# Patient Record
Sex: Female | Born: 1965
Health system: Southern US, Community
[De-identification: ages and names within clinical notes are randomized; demographics above are authoritative.]

## PROBLEM LIST (undated history)

## (undated) ENCOUNTER — Ambulatory Visit (HOSPITAL_COMMUNITY): Disposition: A | Discharge status: Home / Self Care | Payer: BC Managed Care – PPO

## (undated) DIAGNOSIS — D649 Anemia, unspecified: Secondary | ICD-10-CM

## (undated) DIAGNOSIS — Z91018 Allergy to other foods: Secondary | ICD-10-CM

## (undated) DIAGNOSIS — T7840XA Allergy, unspecified, initial encounter: Secondary | ICD-10-CM

## (undated) DIAGNOSIS — H409 Unspecified glaucoma: Secondary | ICD-10-CM

## (undated) DIAGNOSIS — R7303 Prediabetes: Secondary | ICD-10-CM

## (undated) DIAGNOSIS — E538 Deficiency of other specified B group vitamins: Secondary | ICD-10-CM

## (undated) DIAGNOSIS — K829 Disease of gallbladder, unspecified: Secondary | ICD-10-CM

## (undated) DIAGNOSIS — E559 Vitamin D deficiency, unspecified: Secondary | ICD-10-CM

## (undated) DIAGNOSIS — M797 Fibromyalgia: Secondary | ICD-10-CM

## (undated) HISTORY — DX: Disease of gallbladder, unspecified: K82.9

## (undated) HISTORY — PX: NO PAST SURGERIES: SHX2092

## (undated) HISTORY — DX: Fibromyalgia: M79.7

## (undated) HISTORY — DX: Allergy, unspecified, initial encounter: T78.40XA

## (undated) HISTORY — DX: Vitamin D deficiency, unspecified: E55.9

## (undated) HISTORY — DX: Allergy to other foods: Z91.018

## (undated) HISTORY — DX: Deficiency of other specified B group vitamins: E53.8

---

## 1999-10-07 ENCOUNTER — Inpatient Hospital Stay (HOSPITAL_COMMUNITY): Admission: AD | Admit: 1999-10-07 | Discharge: 1999-10-07 | Payer: Self-pay | Admitting: *Deleted

## 1999-10-09 ENCOUNTER — Encounter (INDEPENDENT_AMBULATORY_CARE_PROVIDER_SITE_OTHER): Payer: Self-pay | Admitting: Specialist

## 1999-10-09 ENCOUNTER — Observation Stay (HOSPITAL_COMMUNITY): Admission: AD | Admit: 1999-10-09 | Discharge: 1999-10-10 | Payer: Self-pay | Admitting: Obstetrics & Gynecology

## 2008-05-20 ENCOUNTER — Emergency Department (HOSPITAL_COMMUNITY): Admission: EM | Admit: 2008-05-20 | Discharge: 2008-05-20 | Payer: Self-pay | Admitting: Family Medicine

## 2008-05-22 ENCOUNTER — Emergency Department (HOSPITAL_COMMUNITY): Admission: EM | Admit: 2008-05-22 | Discharge: 2008-05-22 | Payer: Self-pay | Admitting: Emergency Medicine

## 2009-12-14 ENCOUNTER — Encounter: Admission: RE | Admit: 2009-12-14 | Discharge: 2009-12-14 | Payer: Self-pay | Admitting: Internal Medicine

## 2010-12-11 ENCOUNTER — Encounter: Payer: Self-pay | Admitting: Internal Medicine

## 2011-09-27 ENCOUNTER — Other Ambulatory Visit: Payer: Self-pay | Admitting: Internal Medicine

## 2011-09-27 DIAGNOSIS — Z1231 Encounter for screening mammogram for malignant neoplasm of breast: Secondary | ICD-10-CM

## 2011-10-23 ENCOUNTER — Ambulatory Visit
Admission: RE | Admit: 2011-10-23 | Discharge: 2011-10-23 | Disposition: A | Discharge status: Home / Self Care | Payer: Managed Care, Other (non HMO) | Source: Ambulatory Visit | Attending: Internal Medicine | Admitting: Internal Medicine

## 2011-10-23 DIAGNOSIS — Z1231 Encounter for screening mammogram for malignant neoplasm of breast: Secondary | ICD-10-CM

## 2012-09-18 ENCOUNTER — Other Ambulatory Visit: Payer: Self-pay | Admitting: Internal Medicine

## 2012-09-18 DIAGNOSIS — Z1231 Encounter for screening mammogram for malignant neoplasm of breast: Secondary | ICD-10-CM

## 2012-11-06 ENCOUNTER — Ambulatory Visit
Admission: RE | Admit: 2012-11-06 | Discharge: 2012-11-06 | Disposition: A | Discharge status: Home / Self Care | Payer: Managed Care, Other (non HMO) | Source: Ambulatory Visit | Attending: Internal Medicine | Admitting: Internal Medicine

## 2012-11-06 DIAGNOSIS — Z1231 Encounter for screening mammogram for malignant neoplasm of breast: Secondary | ICD-10-CM

## 2013-12-03 ENCOUNTER — Other Ambulatory Visit: Payer: Self-pay

## 2013-12-03 DIAGNOSIS — Z1231 Encounter for screening mammogram for malignant neoplasm of breast: Secondary | ICD-10-CM

## 2013-12-19 ENCOUNTER — Ambulatory Visit: Payer: Managed Care, Other (non HMO)

## 2014-01-01 ENCOUNTER — Ambulatory Visit: Payer: Managed Care, Other (non HMO)

## 2015-01-04 ENCOUNTER — Ambulatory Visit: Payer: Managed Care, Other (non HMO)

## 2015-01-11 ENCOUNTER — Ambulatory Visit: Payer: Managed Care, Other (non HMO)

## 2015-04-12 ENCOUNTER — Other Ambulatory Visit: Payer: Self-pay

## 2015-04-12 DIAGNOSIS — Z1231 Encounter for screening mammogram for malignant neoplasm of breast: Secondary | ICD-10-CM

## 2015-04-15 ENCOUNTER — Ambulatory Visit: Payer: Self-pay

## 2015-04-20 ENCOUNTER — Ambulatory Visit
Admission: RE | Admit: 2015-04-20 | Discharge: 2015-04-20 | Disposition: A | Discharge status: Home / Self Care | Payer: BLUE CROSS/BLUE SHIELD | Source: Ambulatory Visit

## 2015-04-20 DIAGNOSIS — Z1231 Encounter for screening mammogram for malignant neoplasm of breast: Secondary | ICD-10-CM

## 2015-08-18 DIAGNOSIS — L659 Nonscarring hair loss, unspecified: Secondary | ICD-10-CM | POA: Insufficient documentation

## 2015-10-21 ENCOUNTER — Other Ambulatory Visit: Payer: Self-pay | Admitting: Gastroenterology

## 2015-12-08 ENCOUNTER — Encounter (HOSPITAL_COMMUNITY): Payer: Self-pay | Admitting: *Deleted

## 2015-12-13 NOTE — Anesthesia Preprocedure Evaluation (Addendum)
Anesthesia Evaluation  Patient identified by MRN, date of birth, ID band Patient awake    Reviewed: Allergy & Precautions, NPO status , Patient's Chart, lab work & pertinent test results  Airway Mallampati: II   Neck ROM: Full    Dental  (+) Teeth Intact, Dental Advisory Given   Pulmonary neg pulmonary ROS,    breath sounds clear to auscultation       Cardiovascular negative cardio ROS   Rhythm:Regular     Neuro/Psych negative neurological ROS  negative psych ROS   GI/Hepatic negative GI ROS, Neg liver ROS,   Endo/Other  negative endocrine ROSMorbid obesity  Renal/GU negative Renal ROS  negative genitourinary   Musculoskeletal negative musculoskeletal ROS (+)   Abdominal (+) + obese,   Peds negative pediatric ROS (+)  Hematology negative hematology ROS (+)   Anesthesia Other Findings HX of glaucoma    Reproductive/Obstetrics negative OB ROS                           Anesthesia Physical Anesthesia Plan  ASA: III  Anesthesia Plan: MAC   Post-op Pain Management:    Induction: Intravenous  Airway Management Planned: Nasal Cannula  Additional Equipment:   Intra-op Plan:   Post-operative Plan:   Informed Consent: I have reviewed the patients History and Physical, chart, labs and discussed the procedure including the risks, benefits and alternatives for the proposed anesthesia with the patient or authorized representative who has indicated his/her understanding and acceptance.     Plan Discussed with:   Anesthesia Plan Comments:        Anesthesia Quick Evaluation

## 2015-12-16 ENCOUNTER — Ambulatory Visit (HOSPITAL_COMMUNITY)
Admission: RE | Admit: 2015-12-16 | Discharge: 2015-12-16 | Disposition: A | Discharge status: Home / Self Care | Payer: BLUE CROSS/BLUE SHIELD | Source: Ambulatory Visit | Attending: Gastroenterology | Admitting: Gastroenterology

## 2015-12-16 ENCOUNTER — Ambulatory Visit (HOSPITAL_COMMUNITY): Payer: BLUE CROSS/BLUE SHIELD | Admitting: Anesthesiology

## 2015-12-16 ENCOUNTER — Encounter (HOSPITAL_COMMUNITY): Payer: Self-pay

## 2015-12-16 ENCOUNTER — Encounter (HOSPITAL_COMMUNITY)
Admission: RE | Disposition: A | Discharge status: Home / Self Care | Payer: Self-pay | Source: Ambulatory Visit | Attending: Gastroenterology

## 2015-12-16 DIAGNOSIS — D123 Benign neoplasm of transverse colon: Secondary | ICD-10-CM | POA: Diagnosis not present

## 2015-12-16 DIAGNOSIS — Z1211 Encounter for screening for malignant neoplasm of colon: Secondary | ICD-10-CM | POA: Diagnosis not present

## 2015-12-16 DIAGNOSIS — K5730 Diverticulosis of large intestine without perforation or abscess without bleeding: Secondary | ICD-10-CM | POA: Insufficient documentation

## 2015-12-16 DIAGNOSIS — K621 Rectal polyp: Secondary | ICD-10-CM | POA: Diagnosis not present

## 2015-12-16 DIAGNOSIS — H409 Unspecified glaucoma: Secondary | ICD-10-CM | POA: Insufficient documentation

## 2015-12-16 DIAGNOSIS — K635 Polyp of colon: Secondary | ICD-10-CM | POA: Diagnosis not present

## 2015-12-16 DIAGNOSIS — K648 Other hemorrhoids: Secondary | ICD-10-CM | POA: Diagnosis not present

## 2015-12-16 DIAGNOSIS — Z6841 Body Mass Index (BMI) 40.0 and over, adult: Secondary | ICD-10-CM | POA: Insufficient documentation

## 2015-12-16 DIAGNOSIS — Z79899 Other long term (current) drug therapy: Secondary | ICD-10-CM | POA: Diagnosis not present

## 2015-12-16 HISTORY — PX: COLONOSCOPY WITH PROPOFOL: SHX5780

## 2015-12-16 HISTORY — DX: Unspecified glaucoma: H40.9

## 2015-12-16 SURGERY — COLONOSCOPY WITH PROPOFOL
Anesthesia: Monitor Anesthesia Care

## 2015-12-16 MED ORDER — PROPOFOL 10 MG/ML IV BOLUS
INTRAVENOUS | Status: DC | PRN
  Administered 2015-12-16 (×2): 100 mg via INTRAVENOUS
  Administered 2015-12-16 (×2): 20 mg via INTRAVENOUS
  Administered 2015-12-16: 80 mg via INTRAVENOUS

## 2015-12-16 MED ORDER — LIDOCAINE HCL (CARDIAC) 20 MG/ML IV SOLN
INTRAVENOUS | Status: DC | PRN
  Administered 2015-12-16: 50 mg via INTRAVENOUS

## 2015-12-16 MED ORDER — PROPOFOL 10 MG/ML IV BOLUS
INTRAVENOUS | Status: AC
Start: 2015-12-16 — End: 2015-12-16
  Filled 2015-12-16: qty 40

## 2015-12-16 MED ORDER — PROPOFOL 10 MG/ML IV BOLUS
INTRAVENOUS | Status: AC
  Filled 2015-12-16: qty 20

## 2015-12-16 MED ORDER — LIDOCAINE HCL (CARDIAC) 20 MG/ML IV SOLN
INTRAVENOUS | Status: AC
  Filled 2015-12-16: qty 5

## 2015-12-16 MED ORDER — LACTATED RINGERS IV SOLN
INTRAVENOUS | Status: DC | PRN
  Administered 2015-12-16: 07:00:00 via INTRAVENOUS

## 2015-12-16 MED ORDER — SODIUM CHLORIDE 0.9 % IV SOLN: INTRAVENOUS | Status: DC

## 2015-12-16 SURGICAL SUPPLY — 21 items

## 2015-12-16 NOTE — Transfer of Care (Signed)
Immediate Anesthesia Transfer of Care Note  Patient: Kelly Ward  Procedure(s) Performed: Procedure(s): COLONOSCOPY WITH PROPOFOL (N/A)  Patient Location: PACU  Anesthesia Type:MAC  Level of Consciousness: awake, alert  and oriented  Airway & Oxygen Therapy: Patient Spontanous Breathing and Patient connected to face mask oxygen  Post-op Assessment: Report given to RN and Post -op Vital signs reviewed and stable  Post vital signs: Reviewed and stable  Last Vitals:  Filed Vitals:   12/16/15 0652  BP: 143/76  Pulse: 80  Temp: 36.8 C  Resp: 10    Complications: No apparent anesthesia complications

## 2015-12-16 NOTE — H&P (Signed)
Kelly Ward is an 50 y.o. female.   Chief Complaint: Colorectal cancer screening. HPI: 50 year old,orbidly obese black female, here for a screening colonoscopy. See office notes details.  Past Medical History  Diagnosis Date  . Glaucoma     both eyes   Past Surgical History  Procedure Laterality Date  . No past surgeries     History reviewed. No pertinent family history. Social History:  reports that she has never smoked. She has never used smokeless tobacco. She reports that she does not drink alcohol or use illicit drugs.  Allergies:  Allergies  Allergen Reactions  . Peanut-Containing Drug Products Hives, Itching and Swelling    Eyes, mouth. throat   Medications Prior to Admission  Medication Sig Dispense Refill  . ALPHAGAN P 0.1 % SOLN 1 DROP IN EACH AFFECTED EYE 3 TIMES A DAY FOR 30 DAYS  6  . COMBIGAN 0.2-0.5 % ophthalmic solution INSTILL 1 DROP IN EACH AFFECTED EYE EVERY 12 HOURS  6  . Multiple Vitamin (MULTIVITAMIN) LIQD Take 5 mLs by mouth daily. TLC Brand    . PEG 3350-KCl-NaBcb-NaCl-NaSulf (PEG-3350/ELECTROLYTES) 236 g SOLR See admin instructions.  0  . topiramate (TOPAMAX) 100 MG tablet Take 100 mg by mouth daily.  2  . diphenhydrAMINE (BENADRYL) 25 mg capsule Take 25 mg by mouth every 6 (six) hours as needed for itching, allergies or sleep.     Review of Systems  Constitutional: Negative.   HENT: Negative.   Eyes: Negative.   Respiratory: Negative.   Cardiovascular: Negative.   Gastrointestinal: Negative.   Genitourinary: Negative.   Musculoskeletal: Negative.   Skin: Negative.   Neurological: Negative.   Endo/Heme/Allergies: Negative.   Psychiatric/Behavioral: Negative.    Blood pressure 143/76, pulse 80, temperature 98.2 F (36.8 C), temperature source Oral, resp. rate 10, height 5\' 7"  (1.702 m), weight 147.419 kg (325 lb), last menstrual period 12/09/2015, SpO2 100 %. Physical Exam  Constitutional: She is oriented to person, place, and time. She  appears well-developed and well-nourished.  HENT:  Head: Normocephalic and atraumatic.  Eyes: Conjunctivae and EOM are normal. Pupils are equal, round, and reactive to light.  Neck: Normal range of motion. Neck supple.  Cardiovascular: Normal rate and regular rhythm.   Respiratory: Effort normal and breath sounds normal.  GI: Soft. Bowel sounds are normal.  Musculoskeletal: Normal range of motion.  Neurological: She is alert and oriented to person, place, and time.  Skin: Skin is warm and dry.  Psychiatric: She has a normal mood and affect. Her behavior is normal. Judgment and thought content normal.    Assessment/Plan Colorectal cancer screening: proceed with a colonoscopy at this time.  Kelly Ward 12/16/2015, 7:18 AM

## 2015-12-16 NOTE — Anesthesia Postprocedure Evaluation (Signed)
Anesthesia Post Note  Patient: Kelly Ward  Procedure(s) Performed: Procedure(s) (LRB): COLONOSCOPY WITH PROPOFOL (N/A)  Patient location during evaluation: PACU Anesthesia Type: MAC Level of consciousness: awake and alert Pain management: pain level controlled Vital Signs Assessment: post-procedure vital signs reviewed and stable Respiratory status: spontaneous breathing, nonlabored ventilation, respiratory function stable and patient connected to nasal cannula oxygen Cardiovascular status: stable and blood pressure returned to baseline Anesthetic complications: no    Last Vitals:  Filed Vitals:   12/16/15 0652 12/16/15 0800  BP: 143/76 130/75  Pulse: 80 78  Temp: 36.8 C   Resp: 10 16    Last Pain: There were no vitals filed for this visit.               Jerrianne Hartin

## 2015-12-16 NOTE — Op Note (Signed)
[facility] [place], [address]   OPERATIVE PROCEDURE REPORT  PATIENT: Kelly Ward, Kelly Ward  MR#: [id number] BIRTHDATE: 24-Feb-1966 GENDER: female ENDOSCOPIST: Edmonia James, MD ASSISTANT:   Dustin Flock, RN & Cristopher Estimable, technician. PROCEDURE DATE: 12/19/15 PRE-PROCEDURE PREPARATION: Patient fasted for 4 hours prior to procedure. The patient was prepped with a gallon of Golytely the night prior to the procedure. PRE-PROCEDURE PHYSICAL: Patient has stable vital signs.  Neck is supple.  There is no JVD, thyromegaly or LAD.  Chest clear to auscultation.  S1 and S2 regular.  Abdomen soft, morbidly obese, non-distended, non-tender with NABS. PROCEDURE:     Colonoscopy with hot snare polypectomy x 1 and multiple cold biopsies. ASA CLASS:     Class II INDICATIONS:     1.  Colorectal cancer screening-average risk patient for colon cancer. MEDICATIONS:     Monitored anesthesia care  DESCRIPTION OF PROCEDURE: After the risks, benefits, and alternatives of the procedure were thoroughly explained [including a 10% missed rate of cancer and polyps], informed consent was obtained.  Digital rectal exam was performed.  The Pentax video colonoscope  [id number]  was introduced through the anus  and advanced to the terminal ileum which was intubated for a short distance , limited by No adverse events experienced.  The quality of the prep was adequate. Multiple washes were done. Small lesions could be missed. The instrument was then slowly withdrawn as the colon was fully examined. Estimated blood loss is zero unless otherwise noted in this procedure report.     COLON FINDINGS: One 8 mm sessile polyp was found in the proximal transverse colon; a hot snare polypectomy was performed x 1-200/20; The resection was complete, the polyp tissue was completely retrieved and sent to histology. Five dimunitive polyps were found in the rectum and the rectosigmoid colon and  these were removed by cold biopsies x 7. There was mild diverticulosis noted in the right and transverse colon. The rest of the colonic mucosa appeared healthy with a normal vascular pattern. No masses or AVMs were noted.  The appendiceal orifice and the ICV were identified and photographed. The terminal ileum appeared normal. Retroflexed views revealed small internal hemorrhoids. The patient tolerated the procedure without immediate complications. The scope was then withdrawn from the patient and the procedure terminated.  TIME TO CECUM:   04 minutes 00 seconds WITHDRAW TIME:  15 minutes 00 seconds  IMPRESSION:     1) One 8 mm sessile polyp was found in the proximal transverse colon; this was removed by hot snare polypectomy x  1. 2) Few small scattered diverticula in the right and transverse colon. 3) Five dimunitive polyps in the rectum and rectosigmoid colon-removed by cold biospies. 4) Small internal hemorrhoids.  RECOMMENDATIONS:     1.  Hold Aspirin and all other NSAIDS for 2 weeks. 2.  Continue current medications 3.  Continue surveillance 4.  High fiber diet with liberal fluid intake. 5.  OP follow-up is advised on a PRN basis. 6.  Repeat Colonscopy in 5-10 years.  REPEAT EXAM:      In 5 years  for a colonoscopy.  If the patient has any abnormal GI symptoms in the interim, she have been advised to contact the office as soon as possible for further recommendations.    REFERRED BY:Kim Karlton Lemon, M.D. eSigned:  Edmonia James, MD December 19, 2015 8:19 AM  CPT CODES:     [id number] Colonoscopy, flexible, proximal to splenic flexure; with removal of  tumor(s), polyp(s), or other lesion(s) by snare technique 45380 Colonoscopy, flexible, proximal to splenic flexure; with biopsy, single or multiple ICD CODES:     Z12.11 Encounter for screening for malignant neoplasm of colon D12.3 Benign neoplasm of transverse colon D12.8 K57.30 Diverticulosis  The ICD and CPT codes recommended by  this software are interpretations from the data that the clinical staff has captured with the software.  The verification of the translation of this report to the ICD and CPT codes and modifiers is the sole responsibility of the health care institution and practicing physician where this report was generated.  [employer] will not be held responsible for the validity of the ICD and CPT codes included on this report.  AMA assumes no liability for data contained or not contained herein. CPT is a Designer, television/film set of the [employer].  PATIENT NAME:  Kelly Ward, Kelly Ward MR#: [id number]

## 2015-12-17 ENCOUNTER — Encounter (HOSPITAL_COMMUNITY): Payer: Self-pay | Admitting: Gastroenterology

## 2016-03-06 DIAGNOSIS — L089 Local infection of the skin and subcutaneous tissue, unspecified: Secondary | ICD-10-CM | POA: Insufficient documentation

## 2016-05-27 ENCOUNTER — Emergency Department (HOSPITAL_COMMUNITY): Payer: BLUE CROSS/BLUE SHIELD

## 2016-05-27 ENCOUNTER — Observation Stay (HOSPITAL_COMMUNITY)
Admission: EM | Admit: 2016-05-27 | Discharge: 2016-05-30 | Disposition: A | Discharge status: Home / Self Care | Payer: BLUE CROSS/BLUE SHIELD | Attending: General Surgery | Admitting: General Surgery

## 2016-05-27 ENCOUNTER — Encounter (HOSPITAL_COMMUNITY): Payer: Self-pay | Admitting: Emergency Medicine

## 2016-05-27 DIAGNOSIS — Z91010 Allergy to peanuts: Secondary | ICD-10-CM | POA: Insufficient documentation

## 2016-05-27 DIAGNOSIS — Z6841 Body Mass Index (BMI) 40.0 and over, adult: Secondary | ICD-10-CM | POA: Insufficient documentation

## 2016-05-27 DIAGNOSIS — K8010 Calculus of gallbladder with chronic cholecystitis without obstruction: Principal | ICD-10-CM | POA: Insufficient documentation

## 2016-05-27 DIAGNOSIS — K810 Acute cholecystitis: Secondary | ICD-10-CM | POA: Diagnosis present

## 2016-05-27 DIAGNOSIS — R1900 Intra-abdominal and pelvic swelling, mass and lump, unspecified site: Secondary | ICD-10-CM

## 2016-05-27 DIAGNOSIS — K819 Cholecystitis, unspecified: Secondary | ICD-10-CM | POA: Diagnosis present

## 2016-05-27 DIAGNOSIS — R1011 Right upper quadrant pain: Secondary | ICD-10-CM

## 2016-05-27 HISTORY — DX: Morbid (severe) obesity due to excess calories: E66.01

## 2016-05-27 LAB — LIPASE, BLOOD: Lipase: 19 U/L (ref 11–51)

## 2016-05-27 LAB — COMPREHENSIVE METABOLIC PANEL
ALBUMIN: 3.5 g/dL (ref 3.5–5.0)
ALK PHOS: 58 U/L (ref 38–126)
ALT: 20 U/L (ref 14–54)
AST: 26 U/L (ref 15–41)
Anion gap: 10 (ref 5–15)
BUN: 15 mg/dL (ref 6–20)
CHLORIDE: 106 mmol/L (ref 101–111)
CO2: 23 mmol/L (ref 22–32)
Calcium: 9.6 mg/dL (ref 8.9–10.3)
Creatinine, Ser: 0.93 mg/dL (ref 0.44–1.00)
GFR calc Af Amer: >60 mL/min (ref >60)
GFR calc non Af Amer: >60 mL/min (ref >60)
Glucose, Bld: 95 mg/dL (ref 65–99)
POTASSIUM: 3.4 mmol/L — AB (ref 3.5–5.1)
SODIUM: 139 mmol/L (ref 135–145)
Total Bilirubin: 0.6 mg/dL (ref 0.3–1.2)
Total Protein: 7 g/dL (ref 6.5–8.1)

## 2016-05-27 LAB — TROPONIN I: Troponin I: <0.03 ng/mL (ref <0.03)

## 2016-05-27 LAB — CBC
HEMATOCRIT: 37.4 % (ref 36.0–46.0)
Hemoglobin: 11.8 g/dL — ABNORMAL LOW (ref 12.0–15.0)
MCH: 25.8 pg — AB (ref 26.0–34.0)
MCHC: 31.6 g/dL (ref 30.0–36.0)
MCV: 81.8 fL (ref 78.0–100.0)
Platelets: 263 10*3/uL (ref 150–400)
RBC: 4.57 MIL/uL (ref 3.87–5.11)
RDW: 14 % (ref 11.5–15.5)
WBC: 10.3 10*3/uL (ref 4.0–10.5)

## 2016-05-27 LAB — URINALYSIS, ROUTINE W REFLEX MICROSCOPIC
Bilirubin Urine: NEGATIVE
GLUCOSE, UA: NEGATIVE mg/dL
Hgb urine dipstick: NEGATIVE
Ketones, ur: NEGATIVE mg/dL
LEUKOCYTES UA: NEGATIVE
Nitrite: NEGATIVE
PH: 8 (ref 5.0–8.0)
Protein, ur: NEGATIVE mg/dL
Specific Gravity, Urine: 1.02 (ref 1.005–1.030)

## 2016-05-27 MED ORDER — ONDANSETRON 4 MG PO TBDP
4.0000 mg | ORAL_TABLET | Freq: Once | ORAL | Status: AC | PRN
  Administered 2016-05-27: 4 mg via ORAL

## 2016-05-27 MED ORDER — ONDANSETRON 4 MG PO TBDP
ORAL_TABLET | ORAL | Status: AC
  Filled 2016-05-27: qty 1

## 2016-05-27 MED ORDER — SODIUM CHLORIDE 0.9 % IV BOLUS (SEPSIS)
1000.0000 mL | Freq: Once | INTRAVENOUS | Status: AC
  Administered 2016-05-27: 1000 mL via INTRAVENOUS

## 2016-05-27 MED ORDER — DEXTROSE 5 % IV SOLN
2.0000 g | Freq: Once | INTRAVENOUS | Status: AC
  Administered 2016-05-27: 2 g via INTRAVENOUS
  Filled 2016-05-27: qty 2

## 2016-05-27 MED ORDER — MORPHINE SULFATE (PF) 4 MG/ML IV SOLN
4.0000 mg | Freq: Once | INTRAVENOUS | Status: AC
  Administered 2016-05-27: 4 mg via INTRAVENOUS
  Filled 2016-05-27: qty 1

## 2016-05-27 NOTE — ED Notes (Signed)
Report attempted, RN to call back. 

## 2016-05-27 NOTE — ED Notes (Signed)
General surgery MD at the bedside.

## 2016-05-27 NOTE — ED Provider Notes (Signed)
CSN: [id number]     Arrival date & time 05/27/16  1923 History   First MD Initiated Contact with Patient 05/27/16 1936     Chief Complaint  Patient presents with  . Abdominal Pain     (Consider location/radiation/quality/duration/timing/severity/associated sxs/prior Treatment) HPI Comments: 50yo F w/ PMH of glaucoma who p/w RUQ abdominal pain. Pt states that she began having severe, constant right upper quadrant pain this evening. The pain does not radiate to her back but moves to her umbilicus at times. She has never had this pain before. She endorses associated nausea and vomiting. No diarrhea or blood in her stool. No fevers, urinary symptoms, or recent illness. Her pain was not related to dinner. No CP or SOB.  Patient is a 50 y.o. female presenting with abdominal pain. The history is provided by the patient.  Abdominal Pain   Past Medical History  Diagnosis Date  . Glaucoma     both eyes   Past Surgical History  Procedure Laterality Date  . No past surgeries    . Colonoscopy with propofol N/A 12/16/2015    Procedure: COLONOSCOPY WITH PROPOFOL;  Surgeon: Juanita Craver, MD;  Location: [facility];  Service: Endoscopy;  Laterality: N/A;   No family history on file. Social History  Substance Use Topics  . Smoking status: Never Smoker   . Smokeless tobacco: Never Used  . Alcohol Use: No   OB History    No data available     Review of Systems  Gastrointestinal: Positive for abdominal pain.   10 Systems reviewed and are negative for acute change except as noted in the HPI.    Allergies  Peanut-containing drug products  Home Medications   Prior to Admission medications   Medication Sig Start Date End Date Taking? Authorizing Provider  ALPHAGAN P 0.1 % SOLN 1 DROP IN EACH AFFECTED EYE 3 TIMES A DAY FOR 30 DAYS 10/02/15   Historical Provider, MD  COMBIGAN 0.2-0.5 % ophthalmic solution INSTILL 1 DROP IN EACH AFFECTED EYE EVERY 12 HOURS 11/18/15   Historical Provider, MD   diphenhydrAMINE (BENADRYL) 25 mg capsule Take 25 mg by mouth every 6 (six) hours as needed for itching, allergies or sleep.    Historical Provider, MD  Multiple Vitamin (MULTIVITAMIN) LIQD Take 5 mLs by mouth daily. TLC Brand    Historical Provider, MD  topiramate (TOPAMAX) 100 MG tablet Take 100 mg by mouth daily. 10/02/15   Historical Provider, MD   BP 111/75 mmHg  Pulse 74  Temp(Src) 98 F (36.7 C) (Oral)  Resp 27  SpO2 100%  LMP 04/27/2016 (Approximate) Physical Exam  Constitutional: She is oriented to person, place, and time. She appears well-developed and well-nourished. She appears distressed.  Holding abdomen, in pain  HENT:  Head: Normocephalic and atraumatic.  Moist mucous membranes  Eyes: Conjunctivae are normal. Pupils are equal, round, and reactive to light.  Neck: Neck supple.  Cardiovascular: Normal rate, regular rhythm and normal heart sounds.   No murmur heard. Pulmonary/Chest: Effort normal and breath sounds normal.  Abdominal: Soft. Bowel sounds are normal. She exhibits no distension. There is tenderness (RUQ and midepigastric tenderness). There is positive Murphy's sign. There is no rebound and no guarding.  Musculoskeletal: She exhibits no edema.  Neurological: She is alert and oriented to person, place, and time.  Fluent speech  Skin: Skin is warm and dry.  Psychiatric: She has a normal mood and affect. Judgment normal.  Nursing note and vitals reviewed.   ED Course  Procedures (including critical care time) Labs Review Labs Reviewed  COMPREHENSIVE METABOLIC PANEL - Abnormal; Notable for the following:    Potassium 3.4 (*)    All other components within normal limits  CBC - Abnormal; Notable for the following:    Hemoglobin 11.8 (*)    MCH 25.8 (*)    All other components within normal limits  LIPASE, BLOOD  TROPONIN I  URINALYSIS, ROUTINE W REFLEX MICROSCOPIC (NOT AT [facility])    Imaging Review US Abdomen Limited Ruq  05/27/2016  CLINICAL DATA:   Right upper quadrant and epigastric pain. EXAM: US ABDOMEN LIMITED - RIGHT UPPER QUADRANT COMPARISON:  None. FINDINGS: Gallbladder: Gallbladder partially distended with small mobile gallstones and dependent sludge. There is mild gallbladder wall thickening of 3 mm. No pericholecystic fluid. Positive sonographic Murphy sign noted by sonographer. Common bile duct: Diameter: 3.7 cm Liver: Heterogeneous and increased in parenchymal echogenicity. No gross focal lesion, however background heterogeneity and body habitus limits assessment. Normal directional flow in the main portal vein. IMPRESSION: 1. Mobile stones and sludge within the gallbladder with mild gallbladder wall thickening. In the presence of positive sonographic Murphy sign, acute cholecystitis is considered. 2. No biliary dilatation. 3. Hepatic steatosis with heterogeneous hepatic parenchyma. Electronically Signed   By: Jeb Levering M.D.   On: 05/27/2016 20:59   I have personally reviewed and evaluated these lab results as part of my medical decision-making.   EKG Interpretation   Date/Time:  Saturday May 27 2016 21:02:17 EDT Ventricular Rate:  71 PR Interval:    QRS Duration: 100 QT Interval:  396 QTC Calculation: 431 R Axis:   -11 Text Interpretation:  Sinus rhythm Probable left atrial enlargement Low  voltage, precordial leads No previous ECGs available Confirmed by Szymon Foiles  MD, Quanah Majka [phone]) on 05/27/2016 9:20:35 PM     Medications  ondansetron (ZOFRAN-ODT) 4 MG disintegrating tablet (not administered)  ondansetron (ZOFRAN-ODT) disintegrating tablet 4 mg (4 mg Oral Given 05/27/16 1932)  morphine 4 MG/ML injection 4 mg (4 mg Intravenous Given 05/27/16 2033)  sodium chloride 0.9 % bolus 1,000 mL (1,000 mLs Intravenous New Bag/Given 05/27/16 2033)    MDM   Final diagnoses:  RUQ pain  Cholecystitis   Patient presents with severe right upper quadrant pain that began this evening, associated with vomiting. No history of these  symptoms. On exam, she was in distress due to pain. Right upper quadrant and midepigastric tenderness noted with positive Murphy sign. Gave the patient Zofran, morphine, and an IV fluid bolus. Obtained above lab work which was unremarkable. Right upper quadrant ultrasound showed multiple gallstones as well as mild gallbladder wall thickening. I'm concerned about early cholecystitis. Gave dose of ceftriaxone. I contacted general surgery and spoke with Dr. Ninfa Linden, who will admit the patient for further care.   Sharlett Iles, MD 05/27/16 2203

## 2016-05-27 NOTE — ED Notes (Signed)
Patient transported to Ultrasound 

## 2016-05-27 NOTE — H&P (Signed)
Kelly Ward is an 50 y.o. female.   Chief Complaint: Abdominal pain HPI: This is a pleasant 50 year old female who presents with the sudden onset of right upper quadrant abdominal pain with nausea and vomiting. She has had no previous similar symptoms. The pain was radiating through to the back. It is described as sharp and moderate to severe. She is otherwise without complaints.  Past Medical History  Diagnosis Date  . Glaucoma     both eyes    Past Surgical History  Procedure Laterality Date  . No past surgeries    . Colonoscopy with propofol N/A 12/16/2015    Procedure: COLONOSCOPY WITH PROPOFOL;  Surgeon: Juanita Craver, MD;  Location: [facility];  Service: Endoscopy;  Laterality: N/A;    No family history on file. Social History:  reports that she has never smoked. She has never used smokeless tobacco. She reports that she does not drink alcohol or use illicit drugs.  Allergies:  Allergies  Allergen Reactions  . Peanut-Containing Drug Products Hives, Itching and Swelling    Eyes, mouth. throat     (Not in a hospital admission)  Results for orders placed or performed during the hospital encounter of 05/27/16 (from the past 48 hour(s))  Lipase, blood     Status: None   Collection Time: 05/27/16  7:35 PM  Result Value Ref Range   Lipase 19 11 - 51 U/L  Comprehensive metabolic panel     Status: Abnormal   Collection Time: 05/27/16  7:35 PM  Result Value Ref Range   Sodium 139 135 - 145 mmol/L   Potassium 3.4 (L) 3.5 - 5.1 mmol/L   Chloride 106 101 - 111 mmol/L   CO2 23 22 - 32 mmol/L   Glucose, Bld 95 65 - 99 mg/dL   BUN 15 6 - 20 mg/dL   Creatinine, Ser 0.93 0.44 - 1.00 mg/dL   Calcium 9.6 8.9 - 10.3 mg/dL   Total Protein 7.0 6.5 - 8.1 g/dL   Albumin 3.5 3.5 - 5.0 g/dL   AST 26 15 - 41 U/L   ALT 20 14 - 54 U/L   Alkaline Phosphatase 58 38 - 126 U/L   Total Bilirubin 0.6 0.3 - 1.2 mg/dL   GFR calc non Af Amer >60 >60 mL/min   GFR calc Af Amer >60 >60 mL/min   Comment: (NOTE) The eGFR has been calculated using the CKD EPI equation. This calculation has not been validated in all clinical situations. eGFR's persistently <60 mL/min signify possible Chronic Kidney Disease.    Anion gap 10 5 - 15  CBC     Status: Abnormal   Collection Time: 05/27/16  7:35 PM  Result Value Ref Range   WBC 10.3 4.0 - 10.5 K/uL   RBC 4.57 3.87 - 5.11 MIL/uL   Hemoglobin 11.8 (L) 12.0 - 15.0 g/dL   HCT 37.4 36.0 - 46.0 %   MCV 81.8 78.0 - 100.0 fL   MCH 25.8 (L) 26.0 - 34.0 pg   MCHC 31.6 30.0 - 36.0 g/dL   RDW 14.0 11.5 - 15.5 %   Platelets 263 150 - 400 K/uL  Troponin I     Status: None   Collection Time: 05/27/16  8:37 PM  Result Value Ref Range   Troponin I <0.03 <0.03 ng/mL  Urinalysis, Routine w reflex microscopic     Status: None   Collection Time: 05/27/16  9:26 PM  Result Value Ref Range   Color, Urine YELLOW YELLOW  APPearance CLEAR CLEAR   Specific Gravity, Urine 1.020 1.005 - 1.030   pH 8.0 5.0 - 8.0   Glucose, UA NEGATIVE NEGATIVE mg/dL   Hgb urine dipstick NEGATIVE NEGATIVE   Bilirubin Urine NEGATIVE NEGATIVE   Ketones, ur NEGATIVE NEGATIVE mg/dL   Protein, ur NEGATIVE NEGATIVE mg/dL   Nitrite NEGATIVE NEGATIVE   Leukocytes, UA NEGATIVE NEGATIVE    Comment: MICROSCOPIC NOT DONE ON URINES WITH NEGATIVE PROTEIN, BLOOD, LEUKOCYTES, NITRITE, OR GLUCOSE <1000 mg/dL.   US Abdomen Limited Ruq  05/27/2016  CLINICAL DATA:  Right upper quadrant and epigastric pain. EXAM: US ABDOMEN LIMITED - RIGHT UPPER QUADRANT COMPARISON:  None. FINDINGS: Gallbladder: Gallbladder partially distended with small mobile gallstones and dependent sludge. There is mild gallbladder wall thickening of 3 mm. No pericholecystic fluid. Positive sonographic Murphy sign noted by sonographer. Common bile duct: Diameter: 3.7 cm Liver: Heterogeneous and increased in parenchymal echogenicity. No gross focal lesion, however background heterogeneity and body habitus limits assessment.  Normal directional flow in the main portal vein. IMPRESSION: 1. Mobile stones and sludge within the gallbladder with mild gallbladder wall thickening. In the presence of positive sonographic Murphy sign, acute cholecystitis is considered. 2. No biliary dilatation. 3. Hepatic steatosis with heterogeneous hepatic parenchyma. Electronically Signed   By: Jeb Levering M.D.   On: 05/27/2016 20:59    Review of Systems  All other systems reviewed and are negative.   Blood pressure 122/110, pulse 82, temperature 98 F (36.7 C), temperature source Oral, resp. rate 18, last menstrual period 04/27/2016, SpO2 100 %. Physical Exam  Constitutional: She is oriented to person, place, and time. She appears well-developed and well-nourished. No distress.  Obese  HENT:  Head: Normocephalic and atraumatic.  Right Ear: External ear normal.  Left Ear: External ear normal.  Nose: Nose normal.  Mouth/Throat: Oropharynx is clear and moist. No oropharyngeal exudate.  Eyes: Conjunctivae are normal. Pupils are equal, round, and reactive to light. Right eye exhibits no discharge. Left eye exhibits no discharge. No scleral icterus.  Neck: Normal range of motion. Neck supple. No tracheal deviation present.  Cardiovascular: Normal rate, regular rhythm, normal heart sounds and intact distal pulses.   No murmur heard. Respiratory: Effort normal and breath sounds normal. No respiratory distress. She has no wheezes.  GI: Soft. There is tenderness. There is guarding.  There is tenderness with guarding in the right upper quadrant  Musculoskeletal: Normal range of motion. She exhibits no edema or tenderness.  Lymphadenopathy:    She has no cervical adenopathy.  Neurological: She is alert and oriented to person, place, and time.  Skin: Skin is warm. No rash noted. She is not diaphoretic. No erythema.  Psychiatric: Her behavior is normal. Judgment normal.     Assessment/Plan Acute cholecystitis with cholelithiasis  I  discussed the diagnosis with the patient. Admission for IV antibiotics and laparoscopic cholecystectomy is recommended. I explained to her that surgery would be tomorrow. She will remain nothing by mouth until surgery. I discussed the surgical procedure briefly with her. She agrees with the admission.  Harl Bowie, MD 05/27/2016, 9:58 PM

## 2016-05-27 NOTE — ED Notes (Signed)
Per pt, c/o RUQ abdominal pain, very tender to palpation. Pain travels down to belly button. Pt crying and hyperventilation.

## 2016-05-27 NOTE — ED Notes (Signed)
Report attempted x 2, CN states she will call back she had a full assignment and is unable to get report at this time.

## 2016-05-28 ENCOUNTER — Encounter (HOSPITAL_COMMUNITY)
Admission: EM | Disposition: A | Discharge status: Home / Self Care | Payer: Self-pay | Source: Home / Self Care | Attending: Emergency Medicine

## 2016-05-28 ENCOUNTER — Observation Stay (HOSPITAL_COMMUNITY): Payer: BLUE CROSS/BLUE SHIELD | Admitting: Critical Care Medicine

## 2016-05-28 ENCOUNTER — Observation Stay (HOSPITAL_COMMUNITY): Payer: BLUE CROSS/BLUE SHIELD

## 2016-05-28 ENCOUNTER — Encounter (HOSPITAL_COMMUNITY): Payer: Self-pay | Admitting: Critical Care Medicine

## 2016-05-28 HISTORY — PX: CHOLECYSTECTOMY: SHX55

## 2016-05-28 LAB — COMPREHENSIVE METABOLIC PANEL
ALBUMIN: 2.9 g/dL — AB (ref 3.5–5.0)
ALT: 91 U/L — ABNORMAL HIGH (ref 14–54)
ANION GAP: 4 — AB (ref 5–15)
AST: 135 U/L — ABNORMAL HIGH (ref 15–41)
Alkaline Phosphatase: 78 U/L (ref 38–126)
BILIRUBIN TOTAL: 0.8 mg/dL (ref 0.3–1.2)
BUN: 12 mg/dL (ref 6–20)
CALCIUM: 9 mg/dL (ref 8.9–10.3)
CO2: 25 mmol/L (ref 22–32)
CREATININE: 0.78 mg/dL (ref 0.44–1.00)
Chloride: 108 mmol/L (ref 101–111)
GLUCOSE: 94 mg/dL (ref 65–99)
POTASSIUM: 3.8 mmol/L (ref 3.5–5.1)
Sodium: 137 mmol/L (ref 135–145)
TOTAL PROTEIN: 6.6 g/dL (ref 6.5–8.1)

## 2016-05-28 LAB — CBC
HEMATOCRIT: 34.1 % — AB (ref 36.0–46.0)
Hemoglobin: 10.7 g/dL — ABNORMAL LOW (ref 12.0–15.0)
MCH: 25.6 pg — ABNORMAL LOW (ref 26.0–34.0)
MCHC: 31.4 g/dL (ref 30.0–36.0)
MCV: 81.6 fL (ref 78.0–100.0)
Platelets: 245 10*3/uL (ref 150–400)
RBC: 4.18 MIL/uL (ref 3.87–5.11)
RDW: 14.1 % (ref 11.5–15.5)
WBC: 8.1 10*3/uL (ref 4.0–10.5)

## 2016-05-28 LAB — SURGICAL PCR SCREEN
MRSA, PCR: NEGATIVE
Staphylococcus aureus: POSITIVE — AB

## 2016-05-28 SURGERY — LAPAROSCOPIC CHOLECYSTECTOMY WITH INTRAOPERATIVE CHOLANGIOGRAM
Anesthesia: General

## 2016-05-28 MED ORDER — ROCURONIUM BROMIDE 100 MG/10ML IV SOLN
INTRAVENOUS | Status: DC | PRN
  Administered 2016-05-28: 40 mg via INTRAVENOUS

## 2016-05-28 MED ORDER — WHITE PETROLATUM GEL
Status: AC
  Administered 2016-05-28: 01:00:00
  Filled 2016-05-28: qty 1

## 2016-05-28 MED ORDER — POTASSIUM CHLORIDE IN NACL 20-0.9 MEQ/L-% IV SOLN
INTRAVENOUS | Status: DC
  Administered 2016-05-28: 11:00:00 via INTRAVENOUS
  Filled 2016-05-28 (×2): qty 1000

## 2016-05-28 MED ORDER — TOPIRAMATE 100 MG PO TABS
100.0000 mg | ORAL_TABLET | Freq: Every day | ORAL | Status: DC
  Filled 2016-05-28 (×3): qty 1

## 2016-05-28 MED ORDER — HYDROMORPHONE HCL 1 MG/ML IJ SOLN
INTRAMUSCULAR | Status: AC
  Filled 2016-05-28: qty 1

## 2016-05-28 MED ORDER — ONDANSETRON HCL 4 MG/2ML IJ SOLN
4.0000 mg | Freq: Four times a day (QID) | INTRAMUSCULAR | Status: DC | PRN
  Administered 2016-05-28: 4 mg via INTRAVENOUS
  Filled 2016-05-28: qty 2

## 2016-05-28 MED ORDER — ENOXAPARIN SODIUM 40 MG/0.4ML ~~LOC~~ SOLN: 40.0000 mg | Freq: Every day | SUBCUTANEOUS | Status: DC

## 2016-05-28 MED ORDER — DIATRIZOATE MEGLUMINE & SODIUM 66-10 % PO SOLN
ORAL | Status: AC
  Administered 2016-05-28: 15:00:00
  Filled 2016-05-28: qty 30

## 2016-05-28 MED ORDER — ONDANSETRON HCL 4 MG/2ML IJ SOLN: 4.0000 mg | Freq: Four times a day (QID) | INTRAMUSCULAR | Status: DC | PRN

## 2016-05-28 MED ORDER — OXYCODONE HCL 5 MG/5ML PO SOLN: 5.0000 mg | Freq: Once | ORAL | Status: DC | PRN

## 2016-05-28 MED ORDER — SODIUM CHLORIDE 0.9 % IR SOLN
Status: DC | PRN
  Administered 2016-05-28: 1000 mL

## 2016-05-28 MED ORDER — HYDROMORPHONE HCL 1 MG/ML IJ SOLN
1.0000 mg | INTRAMUSCULAR | Status: DC | PRN
  Administered 2016-05-28 (×4): 1 mg via INTRAVENOUS
  Filled 2016-05-28 (×4): qty 1

## 2016-05-28 MED ORDER — POTASSIUM CHLORIDE IN NACL 20-0.9 MEQ/L-% IV SOLN
INTRAVENOUS | Status: DC
  Administered 2016-05-28: 01:00:00 via INTRAVENOUS
  Filled 2016-05-28: qty 1000

## 2016-05-28 MED ORDER — DEXTROSE 5 % IV SOLN
2.0000 g | INTRAVENOUS | Status: DC
  Administered 2016-05-28: 2 g via INTRAVENOUS
  Filled 2016-05-28: qty 2

## 2016-05-28 MED ORDER — LACTATED RINGERS IV SOLN
INTRAVENOUS | Status: DC | PRN
  Administered 2016-05-28 (×2): via INTRAVENOUS

## 2016-05-28 MED ORDER — BRIMONIDINE TARTRATE-TIMOLOL 0.2-0.5 % OP SOLN: 1.0000 [drp] | Freq: Two times a day (BID) | OPHTHALMIC | Status: DC

## 2016-05-28 MED ORDER — SUGAMMADEX SODIUM 200 MG/2ML IV SOLN
INTRAVENOUS | Status: AC
  Filled 2016-05-28: qty 4

## 2016-05-28 MED ORDER — OXYCODONE HCL 5 MG PO TABS: 5.0000 mg | ORAL_TABLET | Freq: Once | ORAL | Status: DC | PRN

## 2016-05-28 MED ORDER — TIMOLOL MALEATE 0.5 % OP SOLN
1.0000 [drp] | Freq: Two times a day (BID) | OPHTHALMIC | Status: DC
  Administered 2016-05-28 – 2016-05-29 (×2): 1 [drp] via OPHTHALMIC
  Filled 2016-05-28: qty 5

## 2016-05-28 MED ORDER — METOCLOPRAMIDE HCL 5 MG/ML IJ SOLN
INTRAMUSCULAR | Status: AC
  Filled 2016-05-28: qty 2

## 2016-05-28 MED ORDER — 0.9 % SODIUM CHLORIDE (POUR BTL) OPTIME
TOPICAL | Status: DC | PRN
  Administered 2016-05-28: 1000 mL

## 2016-05-28 MED ORDER — IOPAMIDOL (ISOVUE-300) INJECTION 61%
INTRAVENOUS | Status: AC
  Filled 2016-05-28: qty 50

## 2016-05-28 MED ORDER — ROCURONIUM BROMIDE 50 MG/5ML IV SOLN
INTRAVENOUS | Status: AC
  Filled 2016-05-28: qty 2

## 2016-05-28 MED ORDER — PROMETHAZINE HCL 25 MG/ML IJ SOLN
INTRAMUSCULAR | Status: AC
  Administered 2016-05-28: 6.25 mg via INTRAVENOUS
  Filled 2016-05-28: qty 1

## 2016-05-28 MED ORDER — DEXAMETHASONE SODIUM PHOSPHATE 10 MG/ML IJ SOLN
INTRAMUSCULAR | Status: DC | PRN
  Administered 2016-05-28: 10 mg via INTRAVENOUS

## 2016-05-28 MED ORDER — BUPIVACAINE-EPINEPHRINE (PF) 0.25% -1:200000 IJ SOLN
INTRAMUSCULAR | Status: AC
  Filled 2016-05-28: qty 30

## 2016-05-28 MED ORDER — FENTANYL CITRATE (PF) 100 MCG/2ML IJ SOLN
INTRAMUSCULAR | Status: DC | PRN
  Administered 2016-05-28 (×2): 50 ug via INTRAVENOUS
  Administered 2016-05-28: 100 ug via INTRAVENOUS

## 2016-05-28 MED ORDER — MIDAZOLAM HCL 5 MG/5ML IJ SOLN
INTRAMUSCULAR | Status: DC | PRN
  Administered 2016-05-28: 2 mg via INTRAVENOUS

## 2016-05-28 MED ORDER — IOPAMIDOL (ISOVUE-300) INJECTION 61%
INTRAVENOUS | Status: AC
  Administered 2016-05-28: 100 mL
  Filled 2016-05-28: qty 100

## 2016-05-28 MED ORDER — BRIMONIDINE TARTRATE 0.2 % OP SOLN
1.0000 [drp] | Freq: Two times a day (BID) | OPHTHALMIC | Status: DC
  Administered 2016-05-28 – 2016-05-29 (×4): 1 [drp] via OPHTHALMIC
  Filled 2016-05-28: qty 5

## 2016-05-28 MED ORDER — MIDAZOLAM HCL 2 MG/2ML IJ SOLN
INTRAMUSCULAR | Status: AC
  Filled 2016-05-28: qty 2

## 2016-05-28 MED ORDER — FENTANYL CITRATE (PF) 250 MCG/5ML IJ SOLN
INTRAMUSCULAR | Status: AC
  Filled 2016-05-28: qty 5

## 2016-05-28 MED ORDER — SUCCINYLCHOLINE CHLORIDE 200 MG/10ML IV SOSY
PREFILLED_SYRINGE | INTRAVENOUS | Status: AC
  Filled 2016-05-28: qty 20

## 2016-05-28 MED ORDER — ONDANSETRON 4 MG PO TBDP: 4.0000 mg | ORAL_TABLET | Freq: Four times a day (QID) | ORAL | Status: DC | PRN

## 2016-05-28 MED ORDER — SUGAMMADEX SODIUM 500 MG/5ML IV SOLN
INTRAVENOUS | Status: DC | PRN
  Administered 2016-05-28: 300 mg via INTRAVENOUS

## 2016-05-28 MED ORDER — LIDOCAINE 2% (20 MG/ML) 5 ML SYRINGE
INTRAMUSCULAR | Status: AC
  Filled 2016-05-28: qty 10

## 2016-05-28 MED ORDER — HYDROMORPHONE HCL 1 MG/ML IJ SOLN
0.2500 mg | INTRAMUSCULAR | Status: DC | PRN
  Administered 2016-05-28 (×2): 0.5 mg via INTRAVENOUS

## 2016-05-28 MED ORDER — BUPIVACAINE-EPINEPHRINE 0.25% -1:200000 IJ SOLN
INTRAMUSCULAR | Status: DC | PRN
  Administered 2016-05-28: 4 mL

## 2016-05-28 MED ORDER — LABETALOL HCL 5 MG/ML IV SOLN
INTRAVENOUS | Status: AC
  Filled 2016-05-28: qty 4

## 2016-05-28 MED ORDER — LIDOCAINE HCL (CARDIAC) 20 MG/ML IV SOLN
INTRAVENOUS | Status: DC | PRN
  Administered 2016-05-28: 60 mg via INTRAVENOUS

## 2016-05-28 MED ORDER — LABETALOL HCL 5 MG/ML IV SOLN
10.0000 mg | Freq: Once | INTRAVENOUS | Status: AC
  Administered 2016-05-28: 10 mg via INTRAVENOUS

## 2016-05-28 MED ORDER — PROPOFOL 10 MG/ML IV BOLUS
INTRAVENOUS | Status: DC | PRN
  Administered 2016-05-28: 180 mg via INTRAVENOUS

## 2016-05-28 MED ORDER — METOCLOPRAMIDE HCL 5 MG/ML IJ SOLN
INTRAMUSCULAR | Status: DC | PRN
  Administered 2016-05-28: 10 mg via INTRAVENOUS

## 2016-05-28 MED ORDER — DEXAMETHASONE SODIUM PHOSPHATE 10 MG/ML IJ SOLN
INTRAMUSCULAR | Status: AC
  Filled 2016-05-28: qty 1

## 2016-05-28 SURGICAL SUPPLY — 48 items
ADH SKN CLS LQ APL DERMABOND (GAUZE/BANDAGES/DRESSINGS) ×1
APPLIER CLIP 5 13 M/L LIGAMAX5 (MISCELLANEOUS) ×3
APR CLP MED LRG 5 ANG JAW (MISCELLANEOUS) ×1
BAG SPEC RTRVL 10 TROC 200 (ENDOMECHANICALS) ×1
BLADE SURG ROTATE 9660 (MISCELLANEOUS) IMPLANT
CANISTER SUCTION 2500CC (MISCELLANEOUS) ×3 IMPLANT
CHLORAPREP W/TINT 26ML (MISCELLANEOUS) ×3 IMPLANT
CLIP APPLIE 5 13 M/L LIGAMAX5 (MISCELLANEOUS) ×1 IMPLANT
CLOSURE WOUND 1/2 X4 (GAUZE/BANDAGES/DRESSINGS) ×1
COVER MAYO STAND STRL (DRAPES) ×3 IMPLANT
COVER SURGICAL LIGHT HANDLE (MISCELLANEOUS) ×3 IMPLANT
DERMABOND ADHESIVE PROPEN (GAUZE/BANDAGES/DRESSINGS) ×2
DERMABOND ADVANCED .7 DNX6 (GAUZE/BANDAGES/DRESSINGS) ×1 IMPLANT
DEVICE TROCAR PUNCTURE CLOSURE (ENDOMECHANICALS) ×3 IMPLANT
DRAPE C-ARM 42X72 X-RAY (DRAPES) ×3 IMPLANT
ELECT REM PT RETURN 9FT ADLT (ELECTROSURGICAL) ×3
ELECTRODE REM PT RTRN 9FT ADLT (ELECTROSURGICAL) ×1 IMPLANT
GLOVE BIO SURGEON STRL SZ7 (GLOVE) ×3 IMPLANT
GLOVE BIOGEL M 7.0 STRL (GLOVE) ×6 IMPLANT
GLOVE BIOGEL PI IND STRL 7.0 (GLOVE) ×2 IMPLANT
GLOVE BIOGEL PI IND STRL 7.5 (GLOVE) ×1 IMPLANT
GLOVE BIOGEL PI INDICATOR 7.0 (GLOVE) ×4
GLOVE BIOGEL PI INDICATOR 7.5 (GLOVE) ×2
GOWN STRL REUS W/ TWL LRG LVL3 (GOWN DISPOSABLE) ×3 IMPLANT
GOWN STRL REUS W/TWL LRG LVL3 (GOWN DISPOSABLE) ×9
HEMOSTAT SNOW SURGICEL 2X4 (HEMOSTASIS) ×3 IMPLANT
KIT BASIN OR (CUSTOM PROCEDURE TRAY) ×3 IMPLANT
KIT ROOM TURNOVER OR (KITS) ×3 IMPLANT
LIQUID BAND (GAUZE/BANDAGES/DRESSINGS) ×3 IMPLANT
NS IRRIG 1000ML POUR BTL (IV SOLUTION) ×3 IMPLANT
PAD ARMBOARD 7.5X6 YLW CONV (MISCELLANEOUS) ×3 IMPLANT
POUCH RETRIEVAL ECOSAC 10 (ENDOMECHANICALS) ×1 IMPLANT
POUCH RETRIEVAL ECOSAC 10MM (ENDOMECHANICALS) ×2
SCISSORS LAP 5X35 DISP (ENDOMECHANICALS) ×3 IMPLANT
SET CHOLANGIOGRAPH 5 50 .035 (SET/KITS/TRAYS/PACK) ×3 IMPLANT
SET IRRIG TUBING LAPAROSCOPIC (IRRIGATION / IRRIGATOR) ×3 IMPLANT
SLEEVE ENDOPATH XCEL 5M (ENDOMECHANICALS) ×6 IMPLANT
SPECIMEN JAR SMALL (MISCELLANEOUS) ×3 IMPLANT
STRIP CLOSURE SKIN 1/2X4 (GAUZE/BANDAGES/DRESSINGS) ×2 IMPLANT
SUT MNCRL AB 4-0 PS2 18 (SUTURE) ×3 IMPLANT
SUT VICRYL 0 UR6 27IN ABS (SUTURE) ×12 IMPLANT
TOWEL OR 17X24 6PK STRL BLUE (TOWEL DISPOSABLE) ×3 IMPLANT
TOWEL OR 17X26 10 PK STRL BLUE (TOWEL DISPOSABLE) ×3 IMPLANT
TRAY LAPAROSCOPIC MC (CUSTOM PROCEDURE TRAY) ×3 IMPLANT
TROCAR BLADELESS 11MM (ENDOMECHANICALS) ×3 IMPLANT
TROCAR XCEL BLUNT TIP 100MML (ENDOMECHANICALS) ×3 IMPLANT
TROCAR XCEL NON-BLD 5MMX100MML (ENDOMECHANICALS) ×3 IMPLANT
TUBING INSUFFLATION (TUBING) ×3 IMPLANT

## 2016-05-28 NOTE — Interval H&P Note (Signed)
History and Physical Interval Note:  05/28/2016 7:28 AM I have seen and examined patient, reviewed all data, will proceed with lap chole today Kelly Ward  has presented today for surgery, with the diagnosis of cholelithiasis  The various methods of treatment have been discussed with the patient and family. After consideration of risks, benefits and other options for treatment, the patient has consented to  Procedure(s): LAPAROSCOPIC CHOLECYSTECTOMY WITH INTRAOPERATIVE CHOLANGIOGRAM (N/A) as a surgical intervention .  The patient's history has been reviewed, patient examined, no change in status, stable for surgery.  I have reviewed the patient's chart and labs.  Questions were answered to the patient's satisfaction.     [name]

## 2016-05-28 NOTE — Anesthesia Postprocedure Evaluation (Signed)
Anesthesia Post Note  Patient: Kelly Ward  Procedure(s) Performed: Procedure(s) (LRB): LAPAROSCOPIC CHOLECYSTECTOMY  (N/A)  Patient location during evaluation: PACU Anesthesia Type: General Level of consciousness: awake and alert and patient cooperative Pain management: pain level controlled Vital Signs Assessment: post-procedure vital signs reviewed and stable Respiratory status: spontaneous breathing and respiratory function stable Cardiovascular status: stable Anesthetic complications: no    Last Vitals:  Filed Vitals:   05/28/16 0920 05/28/16 0930  BP: 174/95 164/104  Pulse: 73 77  Temp:    Resp: 16 17    Last Pain:  Filed Vitals:   05/28/16 0930  PainSc: [place]

## 2016-05-28 NOTE — Anesthesia Preprocedure Evaluation (Addendum)
Anesthesia Evaluation  Patient identified by MRN, date of birth, ID band Patient awake    Reviewed: Allergy & Precautions, H&P , NPO status , Patient's Chart, lab work & pertinent test results  Airway Mallampati: II   Neck ROM: full    Dental  (+) Dental Advisory Given   Pulmonary neg pulmonary ROS,    breath sounds clear to auscultation       Cardiovascular negative cardio ROS   Rhythm:regular Rate:Normal     Neuro/Psych    GI/Hepatic   Endo/Other  Morbid obesity  Renal/GU      Musculoskeletal   Abdominal   Peds  Hematology   Anesthesia Other Findings   Reproductive/Obstetrics                            Anesthesia Physical Anesthesia Plan  ASA: II  Anesthesia Plan: General   Post-op Pain Management:    Induction: Intravenous, Rapid sequence and Cricoid pressure planned  Airway Management Planned: Oral ETT  Additional Equipment:   Intra-op Plan:   Post-operative Plan: Extubation in OR  Informed Consent: I have reviewed the patients History and Physical, chart, labs and discussed the procedure including the risks, benefits and alternatives for the proposed anesthesia with the patient or authorized representative who has indicated his/her understanding and acceptance.   Dental advisory given  Plan Discussed with: Anesthesiologist and Surgeon  Anesthesia Plan Comments:        Anesthesia Quick Evaluation

## 2016-05-28 NOTE — Anesthesia Procedure Notes (Signed)
Procedure Name: Intubation Date/Time: 05/28/2016 7:40 AM Performed by: Merrilyn Puma B Pre-anesthesia Checklist: Patient identified, Emergency Drugs available, Suction available, Patient being monitored and Timeout performed Patient Re-evaluated:Patient Re-evaluated prior to inductionOxygen Delivery Method: Circle system utilized Preoxygenation: Pre-oxygenation with 100% oxygen Intubation Type: IV induction, Rapid sequence and Cricoid Pressure applied Laryngoscope Size: Mac and 3 Grade View: Grade II Tube type: Oral Tube size: 7.5 mm Number of attempts: 1 Airway Equipment and Method: Stylet Placement Confirmation: breath sounds checked- equal and bilateral,  CO2 detector,  positive ETCO2 and ETT inserted through vocal cords under direct vision Secured at: 21 cm Tube secured with: Tape Dental Injury: Teeth and Oropharynx as per pre-operative assessment

## 2016-05-28 NOTE — Transfer of Care (Signed)
Immediate Anesthesia Transfer of Care Note  Patient: Kelly Ward  Procedure(s) Performed: Procedure(s): LAPAROSCOPIC CHOLECYSTECTOMY  (N/A)  Patient Location: PACU  Anesthesia Type:General  Level of Consciousness: awake, alert  and oriented  Airway & Oxygen Therapy: Patient Spontanous Breathing and Patient connected to face mask oxygen  Post-op Assessment: Report given to RN, Post -op Vital signs reviewed and stable and Patient moving all extremities X 4  Post vital signs: Reviewed and stable  Last Vitals:  Filed Vitals:   05/28/16 0005 05/28/16 0616  BP: 149/80 135/69  Pulse: 80 61  Temp: 36.5 C 36.7 C  Resp: 20 19    Last Pain:  Filed Vitals:   05/28/16 0617  PainSc: Asleep       0859 - HR 78, RR 19, Sats 100% on 6L FM, BP [id number]  Complications: No apparent anesthesia complications

## 2016-05-28 NOTE — Progress Notes (Signed)
Wearing wig, no hair pins however

## 2016-05-28 NOTE — Op Note (Signed)
Preoperative diagnosis:acute cholecystitis Postoperative diagnosis: same as above, pelvic mass Procedure: laparoscopic cholecystectomy Surgeon: Dr Serita Grammes Anesthesia: general EBL: minimal Drains none Specimen gb and contents to pathology Complications: none Sponge count correct at completion Disposition to recovery stable  Indications: This is a 1 yof with signs and symptoms of acute cholecystitis.  She has [place] with gallstones. I discussed laparoscopic cholecystectomy with her.    Procedure: After informed consent was obtained the patient was taken to the operating room. She was already on antibiotics. Sequential compression devices were on her legs. She was placed under general anesthesia without complication. Her abdomen was prepped and draped in the standard sterile surgical fashion. A surgical timeout was then performed.  I infiltrated marcaine below the umbilicus. I then made a vertical incision and grasped the fascia.  I then placed a 0 vicryl pursestring suture and inserted a hasson trocar. The abdomen was insufflated with 15 mm Hg pressure. I then inserted 3 further 5 mm trocars in the upper abdomen.  I was able to grasp the gallbladder and retract it cephalad and lateral.the gallbladder was white in color and clearly had acute cholecystitis. It was also full of stones making manipulating it difficult.  I then obtained the critical view of safety. Her cystic duct was very short and was inflamed so I elected not to do a cholangiogram.  I clipped and divided the cystic artery. I treated the anterior and posterior branches separately. I treated the cystic duct in a similar fashion. The duct was viable and the clips traversed the duct. I did spill some stones during this process.  I then removed the gallbladder from the liver bed and placed it in a bag. I changed the epigastric trocar to a 10 mm trocar and evacuated these stones.  I then obtained hemostasis and irrigated. I did  place surgicel snow in the liver bed at completion. I then removed the umbilical trocar and closed my pursestring which broke. I then placed 4 other 0 vicryl sutures to completely close obliterate the defect.with the endoclose device.during this i noticed a mass near the umbilicus that appears it might be a large fibroid. I will obtain a ct scan postop to assess this further.  I then desufflated the abdomen and removed all my remaining trocars. I then closed these with 4-0 Monocryl and Dermabond. She tolerated this well was extubated and transferred to the recovery room in stable condition

## 2016-05-28 NOTE — Discharge Instructions (Signed)
[facility] LAPAROSCOPIC SURGERY: POST OP INSTRUCTIONS  Always review your discharge instruction sheet given to you by the facility where your surgery was performed. IF YOU HAVE DISABILITY OR FAMILY LEAVE FORMS, YOU MUST BRING THEM TO THE OFFICE FOR PROCESSING.   DO NOT GIVE THEM TO YOUR DOCTOR.  1. A prescription for pain medication may be given to you upon discharge.  Take your pain medication as prescribed, if needed.  If narcotic pain medicine is not needed, then you may take acetaminophen (Tylenol), naprosyn (Alleve), or ibuprofen (Advil) as needed. 2. Take your usually prescribed medications unless otherwise directed. 3. If you need a refill on your pain medication, please contact your pharmacy.  They will contact our office to request authorization. Prescriptions will not be filled after 5pm or on week-ends. 4. You should follow a light diet the first few days after arrival home, such as soup and crackers, etc.  Be sure to include lots of fluids daily. 5. Most patients will experience some swelling and bruising in the area of the incisions.  Ice packs will help.  Swelling and bruising can take several days to resolve.  6. It is common to experience some constipation if taking pain medication after surgery.  Increasing fluid intake and taking a stool softener (such as Colace) will usually help or prevent this problem from occurring.  A mild laxative (Milk of Magnesia or Miralax) should be taken according to package instructions if there are no bowel movements after 48 hours. 7. Unless discharge instructions indicate otherwise, you may remove your bandages 48 hours after surgery, and you may shower at that time.  You may have steri-strips (small skin tapes) in place directly over the incision.  These strips should be left on the skin for 7-10 days.  If your surgeon used skin glue on the incision, you may shower in 24 hours.  The glue will flake  off over the next 2-3 weeks.  Any sutures or staples will be removed at the office during your follow-up visit. 8. ACTIVITIES:  You may resume regular (light) daily activities beginning the next day--such as daily self-care, walking, climbing stairs--gradually increasing activities as tolerated.  You may have sexual intercourse when it is comfortable.  Refrain from any heavy lifting or straining until approved by your doctor. a. You may drive when you are no longer taking prescription pain medication, you can comfortably wear a seatbelt, and you can safely maneuver your car and apply brakes. b. RETURN TO WORK:  __________________________________________________________ 9. You should see your doctor in the office for a follow-up appointment approximately 2-3 weeks after your surgery.  Make sure that you call for this appointment within a day or two after you arrive home to insure a convenient appointment time. 10. OTHER INSTRUCTIONS: __________________________________________________________________________________________________________________________ __________________________________________________________________________________________________________________________ WHEN TO CALL YOUR DOCTOR: 1. Fever over 101.0 2. Inability to urinate 3. Continued bleeding from incision. 4. Increased pain, redness, or drainage from the incision. 5. Increasing abdominal pain  The clinic staff is available to answer your questions during regular business hours.  Please don't hesitate to call and ask to speak to one of the nurses for clinical concerns.  If you have a medical emergency, go to the nearest emergency room or call 911.  A surgeon from [facility] is always on call at the hospital. [address], [address], [place], [place]  [address] ? [address] [address], [place], [place]   [address] [phone] ? [phone] ? [phone] Web site: [email or web address]  

## 2016-05-29 ENCOUNTER — Encounter (HOSPITAL_COMMUNITY): Payer: Self-pay | Admitting: General Surgery

## 2016-05-29 ENCOUNTER — Encounter: Payer: Self-pay | Admitting: General Surgery

## 2016-05-29 HISTORY — DX: Morbid (severe) obesity due to excess calories: E66.01

## 2016-05-29 MED ORDER — HYDROMORPHONE HCL 1 MG/ML IJ SOLN
0.5000 mg | INTRAMUSCULAR | Status: DC | PRN
  Administered 2016-05-29: 0.5 mg via INTRAVENOUS
  Filled 2016-05-29: qty 1

## 2016-05-29 MED ORDER — IBUPROFEN 200 MG PO TABS: ORAL_TABLET | ORAL | Status: DC

## 2016-05-29 MED ORDER — OXYCODONE-ACETAMINOPHEN 5-325 MG PO TABS
1.0000 | ORAL_TABLET | ORAL | Status: DC | PRN
  Administered 2016-05-29 – 2016-05-30 (×4): 2 via ORAL
  Filled 2016-05-29 (×4): qty 2

## 2016-05-29 MED ORDER — OXYCODONE-ACETAMINOPHEN 5-325 MG PO TABS: 1.0000 | ORAL_TABLET | ORAL | Status: DC | PRN

## 2016-05-29 MED ORDER — IBUPROFEN 600 MG PO TABS
600.0000 mg | ORAL_TABLET | Freq: Four times a day (QID) | ORAL | Status: DC | PRN
  Administered 2016-05-29 (×2): 600 mg via ORAL
  Filled 2016-05-29 (×2): qty 1

## 2016-05-29 MED ORDER — SIMETHICONE 80 MG PO CHEW
80.0000 mg | CHEWABLE_TABLET | ORAL | Status: DC | PRN
Start: 2016-05-29 — End: 2016-05-30
  Administered 2016-05-29 – 2016-05-30 (×2): 80 mg via ORAL
  Filled 2016-05-29 (×2): qty 1

## 2016-05-29 NOTE — Progress Notes (Signed)
1 Day Post-Op  Subjective: No complaints has not eaten or taken PO analgesic, up to BR, waiting on breakfast.  Objective: Vital signs in last 24 hours: Temp:  [97.6 F (36.4 C)-99.8 F (37.7 C)] 98.8 F (37.1 C) (07/10 0500) Pulse Rate:  [63-82] 71 (07/10 0500) Resp:  [13-24] 18 (07/10 0500) BP: (117-174)/(62-104) 117/62 mmHg (07/10 0500) SpO2:  [93 %-100 %] 98 % (07/10 0500) Weight:  [156.536 kg (345 lb 1.6 oz)] 156.536 kg (345 lb 1.6 oz) (07/09 1015)  420 PO Urine 800 Afebrile, VSS No labs  Intake/Output from previous day: 07/09 0701 - 07/10 0700 In: 1420 [P.O.:420; I.V.:1000] Out: 820 [Urine:800; Blood:20] Intake/Output this shift:    General appearance: alert, cooperative and no distress GI: soft, non-tender; bowel sounds normal; no masses,  no organomegaly  Lab Results:   Recent Labs  05/27/16 1935 05/28/16 0403  WBC 10.3 8.1  HGB 11.8* 10.7*  HCT 37.4 34.1*  PLT 263 245    BMET  Recent Labs  05/27/16 1935 05/28/16 0403  NA 139 137  K 3.4* 3.8  CL 106 108  CO2 23 25  GLUCOSE 95 94  BUN 15 12  CREATININE 0.93 0.78  CALCIUM 9.6 9.0   PT/INR No results for input(s): LABPROT, INR in the last 72 hours.   Recent Labs Lab 05/27/16 1935 05/28/16 0403  AST 26 135*  ALT 20 91*  ALKPHOS 58 78  BILITOT 0.6 0.8  PROT 7.0 6.6  ALBUMIN 3.5 2.9*     Lipase     Component Value Date/Time   LIPASE 19 05/27/2016 1935     Studies/Results: Ct Abdomen Pelvis W Contrast  05/28/2016  CLINICAL DATA:  Postop laparoscopic cholecystectomy, abdominal distention, pelvic mass EXAM: CT ABDOMEN AND PELVIS WITH CONTRAST TECHNIQUE: Multidetector CT imaging of the abdomen and pelvis was performed using the standard protocol following bolus administration of intravenous contrast. CONTRAST:  191mL ISOVUE-300 IOPAMIDOL (ISOVUE-300) INJECTION 61% COMPARISON:  None. FINDINGS: Lower chest:  Lung bases are clear. Hepatobiliary: Liver is within normal limits. No  suspicious/enhancing hepatic lesions. Status post cholecystectomy. Trace fluid in the surgical bed (series 2/ image 22), compatible with a postoperative seroma. No intrahepatic or extrahepatic ductal dilatation. Pancreas: Within normal limits. Spleen: Within normal limits. Adrenals/Urinary Tract: Adrenal glands are within normal limits. Kidneys are within normal limits.  No hydronephrosis. Bladder is mildly thick-walled although underdistended. Stomach/Bowel: Stomach is within normal limits. No evidence of bowel obstruction. Normal appendix (series 2/ image 48). Scattered colonic diverticulosis, without evidence of diverticulitis. Vascular/Lymphatic: No evidence of abdominal aortic aneurysm. No suspicious abdominopelvic lymphadenopathy. Reproductive: Enlarged uterus with multiple uterine fibroids, including a dominant 6.4 cm intramural fibroid in the right uterine fundus (series 2/ image 69). Left ovary is within normal limits.  No right adnexal mass. Other: No abdominopelvic ascites. Musculoskeletal: Mild degenerative changes of the visualized thoracolumbar spine. IMPRESSION: Status post cholecystectomy. Trace fluid in the surgical bed, compatible with a postoperative seroma. Enlarged, fibroid uterus, likely corresponding to the pelvic mass on laparoscopy. Electronically Signed   By: Julian Hy M.D.   On: 05/28/2016 17:49   US Abdomen Limited Ruq  05/27/2016  CLINICAL DATA:  Right upper quadrant and epigastric pain. EXAM: US ABDOMEN LIMITED - RIGHT UPPER QUADRANT COMPARISON:  None. FINDINGS: Gallbladder: Gallbladder partially distended with small mobile gallstones and dependent sludge. There is mild gallbladder wall thickening of 3 mm. No pericholecystic fluid. Positive sonographic Murphy sign noted by sonographer. Common bile duct: Diameter: 3.7 cm Liver: Heterogeneous  and increased in parenchymal echogenicity. No gross focal lesion, however background heterogeneity and body habitus limits assessment.  Normal directional flow in the main portal vein. IMPRESSION: 1. Mobile stones and sludge within the gallbladder with mild gallbladder wall thickening. In the presence of positive sonographic Murphy sign, acute cholecystitis is considered. 2. No biliary dilatation. 3. Hepatic steatosis with heterogeneous hepatic parenchyma. Electronically Signed   By: Jeb Levering M.D.   On: 05/27/2016 20:59   Prior to Admission medications   Medication Sig Start Date End Date Taking? Authorizing Provider  calcium carbonate (TUMS - DOSED IN MG ELEMENTAL CALCIUM) 500 MG chewable tablet Chew 2 tablets by mouth daily as needed for indigestion or heartburn.   Yes Historical Provider, MD  COMBIGAN 0.2-0.5 % ophthalmic solution INSTILL 1 DROP IN EACH AFFECTED EYE EVERY 12 HOURS 11/18/15  Yes Historical Provider, MD  diphenhydrAMINE (BENADRYL) 25 mg capsule Take 25 mg by mouth every 6 (six) hours as needed for itching, allergies or sleep.   Yes Historical Provider, MD  Multiple Vitamin (MULTIVITAMIN) LIQD Take 5 mLs by mouth daily. TLC Brand   Yes Historical Provider, MD  topiramate (TOPAMAX) 100 MG tablet Take 100 mg by mouth daily. 10/02/15   Historical Provider, MD    Medications: . brimonidine  1 drop Both Eyes Q12H  . cefTRIAXone (ROCEPHIN)  IV  2 g Intravenous Q24H  . enoxaparin (LOVENOX) injection  40 mg Subcutaneous Daily  . timolol  1 drop Both Eyes Q12H  . topiramate  100 mg Oral Daily   . 0.9 % NaCl with KCl 20 mEq / L 50 mL/hr at 05/28/16 1115    Assessment/Plan Acute cholecystitis, large uterine fibroid Laparoscopic cholecystectomy, 05/28/16, Dr. Donne Hazel Glaucoma Body mass index is 54. FEN:  Regular diet/IV fluids ID:  Rocephin x 2 days DVT:  Lovenox    Plan:  Advance diet, walk in halls, PO pain meds and home later this AM.      Earnstine Regal 05/29/2016 [phone]

## 2016-05-29 NOTE — Discharge Summary (Deleted)
Physician Discharge Summary  Patient ID: DREMA STAUDT MRN: [id number] DOB/AGE: Oct 27, 1966 50 y.o.  Admit date: 05/27/2016 Discharge date: 05/30/16 Admission Diagnoses:  Acute cholecystitis Pelvic mass Body mass index is 54  Discharge Diagnoses:  Principal Problem:   Acute cholecystitis Active Problems:   Severe obesity (BMI >= 40) (HCC)   PROCEDURES: Laparoscopic cholecystectomy, 05/28/16, Dr. Berneice Gandy Course: This is a pleasant 50 year old female who presents with the sudden onset of right upper quadrant abdominal pain with nausea and vomiting. She has had no previous similar symptoms. The pain was radiating through to the back. It is described as sharp and moderate to severe. She is otherwise without complaints. She was seen in the Ed and admitted to the hospital.  She was taken to the OR the following day.  She tolerated the procedure well.  Diet was advanced and she was ready for discharge the following AM.      CBC Latest Ref Rng 05/28/2016 05/27/2016  WBC 4.0 - 10.5 K/uL 8.1 10.3  Hemoglobin 12.0 - 15.0 g/dL 10.7(L) 11.8(L)  Hematocrit 36.0 - 46.0 % 34.1(L) 37.4  Platelets 150 - 400 K/uL 245 263    CMP Latest Ref Rng 05/28/2016 05/27/2016  Glucose 65 - 99 mg/dL 94 95  BUN 6 - 20 mg/dL 12 15  Creatinine 0.44 - 1.00 mg/dL 0.78 0.93  Sodium 135 - 145 mmol/L 137 139  Potassium 3.5 - 5.1 mmol/L 3.8 3.4(L)  Chloride 101 - 111 mmol/L 108 106  CO2 22 - 32 mmol/L 25 23  Calcium 8.9 - 10.3 mg/dL 9.0 9.6  Total Protein 6.5 - 8.1 g/dL 6.6 7.0  Total Bilirubin 0.3 - 1.2 mg/dL 0.8 0.6  Alkaline Phos 38 - 126 U/L 78 58  AST 15 - 41 U/L 135(H) 26  ALT 14 - 54 U/L 91(H) 20       Disposition: 01-Home or Self Care     Medication List    TAKE these medications        calcium carbonate 500 MG chewable tablet  Commonly known as:  TUMS - dosed in mg elemental calcium  Chew 2 tablets by mouth daily as needed for indigestion or heartburn.     COMBIGAN 0.2-0.5 %  ophthalmic solution  Generic drug:  brimonidine-timolol  INSTILL 1 DROP IN EACH AFFECTED EYE EVERY 12 HOURS     diphenhydrAMINE 25 mg capsule  Commonly known as:  BENADRYL  Take 25 mg by mouth every 6 (six) hours as needed for itching, allergies or sleep.     ibuprofen 200 MG tablet  Commonly known as:  ADVIL,MOTRIN  Take 2-3 tablets every 6 hours as needed for pain.     multivitamin Liqd  Take 5 mLs by mouth daily. TLC Brand     oxyCODONE-acetaminophen 5-325 MG tablet  Commonly known as:  PERCOCET/ROXICET  Take 1-2 tablets by mouth every 4 (four) hours as needed for moderate pain.     simethicone 80 MG chewable tablet  Commonly known as:  MYLICON  Chew 1 tablet (80 mg total) by mouth 4 (four) times daily as needed for flatulence.     topiramate 100 MG tablet  Commonly known as:  TOPAMAX  Take 100 mg by mouth daily.       Follow-up Information    Follow up with [facility] On 06/21/2016.   Specialty:  General Surgery   Why:  Your appointment is 06/14/16 at 8:30 AM,be at the office 30 minutes early for check  in.   Contact information:   [place] [address] [phone]       Follow up with Salena Saner., MD.   Specialty:  Internal Medicine   Why:  Call and follow up with her for medical issues, let her follow up on your CT scan.   Contact information:   [address] [place] [address] [phone]       Signed: Jill Alexanders 05/31/2016, 6:54 AM

## 2016-05-30 MED ORDER — SIMETHICONE 80 MG PO CHEW: 80.0000 mg | CHEWABLE_TABLET | Freq: Four times a day (QID) | ORAL | Status: DC | PRN

## 2016-05-30 NOTE — Progress Notes (Signed)
Discussed discharge summary with patient. Reviewed all medications with patient. Patient received work note and Rx. Patient ready for discharge.

## 2016-05-30 NOTE — Discharge Summary (Signed)
Patient ID: Kelly Ward MRN: [id number] DOB/AGE: [birth year] 50 y.o.  Admit date: 05/27/2016 Discharge date: 05/30/16 Admission Diagnoses:  Acute cholecystitis Pelvic mass Body mass index is 54  Discharge Diagnoses:  Principal Problem:  Acute cholecystitis Active Problems:  Severe obesity (BMI >= 40) (HCC)   PROCEDURES: Laparoscopic cholecystectomy, 05/28/16, Dr. Berneice Gandy Course: This is a pleasant 50 year old female who presents with the sudden onset of right upper quadrant abdominal pain with nausea and vomiting. She has had no previous similar symptoms. The pain was radiating through to the back. It is described as sharp and moderate to severe. She is otherwise without complaints. She was seen in the Ed and admitted to the hospital. She was taken to the OR the following day. She tolerated the procedure well. Diet was advanced and she was ready for discharge the following AM.     CBC Latest Ref Rng 05/28/2016 05/27/2016  WBC 4.0 - 10.5 K/uL 8.1 10.3  Hemoglobin 12.0 - 15.0 g/dL 10.7(L) 11.8(L)  Hematocrit 36.0 - 46.0 % 34.1(L) 37.4  Platelets 150 - 400 K/uL 245 263    CMP Latest Ref Rng 05/28/2016 05/27/2016  Glucose 65 - 99 mg/dL 94 95  BUN 6 - 20 mg/dL 12 15  Creatinine 0.44 - 1.00 mg/dL 0.78 0.93  Sodium 135 - 145 mmol/L 137 139  Potassium 3.5 - 5.1 mmol/L 3.8 3.4(L)  Chloride 101 - 111 mmol/L 108 106  CO2 22 - 32 mmol/L 25 23  Calcium 8.9 - 10.3 mg/dL 9.0 9.6  Total Protein 6.5 - 8.1 g/dL 6.6 7.0  Total Bilirubin 0.3 - 1.2 mg/dL 0.8 0.6  Alkaline Phos 38 - 126 U/L 78 58  AST 15 - 41 U/L 135(H) 26  ALT 14 - 54 U/L 91(H) 20       Disposition: 01-Home or Self Care     Medication List    TAKE these medications       calcium carbonate 500 MG chewable tablet  Commonly known as: TUMS - dosed in mg elemental calcium  Chew 2 tablets by mouth daily as  needed for indigestion or heartburn.     COMBIGAN 0.2-0.5 % ophthalmic solution  Generic drug: brimonidine-timolol  INSTILL 1 DROP IN EACH AFFECTED EYE EVERY 12 HOURS     diphenhydrAMINE 25 mg capsule  Commonly known as: BENADRYL  Take 25 mg by mouth every 6 (six) hours as needed for itching, allergies or sleep.     ibuprofen 200 MG tablet  Commonly known as: ADVIL,MOTRIN  Take 2-3 tablets every 6 hours as needed for pain.     multivitamin Liqd  Take 5 mLs by mouth daily. TLC Brand     oxyCODONE-acetaminophen 5-325 MG tablet  Commonly known as: PERCOCET/ROXICET  Take 1-2 tablets by mouth every 4 (four) hours as needed for moderate pain.     simethicone 80 MG chewable tablet  Commonly known as: MYLICON  Chew 1 tablet (80 mg total) by mouth 4 (four) times daily as needed for flatulence.     topiramate 100 MG tablet  Commonly known as: TOPAMAX  Take 100 mg by mouth daily.       Follow-up Information    Follow up with [facility] On 06/21/2016.   Specialty: General Surgery   Why: Your appointment is 06/14/16 at 8:30 AM,be at the office 30 minutes early for check in.   Contact information:   [address] [place]  [address] [phone]  Follow up with Salena Saner., MD.   Specialty: Internal Medicine   Why: Call and follow up with her for medical issues, let her follow up on your CT scan.   Contact information:   [address] [place] [address] [id number]       Signed: Jill Alexanders 05/31/2016, 6:54 AM

## 2016-05-30 NOTE — Progress Notes (Signed)
[facility] Progress Note  2 Days Post-Op  Subjective: Sitting up in bed in NAD. Reports some gas pain this AM. Tolerating diet. + flatus. Urinating without hesitancy. Ambulating.  Objective: Vital signs in last 24 hours: Temp:  [97.6 F (36.4 C)-98 F (36.7 C)] 97.6 F (36.4 C) (07/11 0519) Pulse Rate:  [64-79] 74 (07/11 0519) Resp:  [18] 18 (07/11 0519) BP: (133-144)/(63-81) 133/63 mmHg (07/11 0519) SpO2:  [97 %-100 %] 97 % (07/11 0519)    Intake/Output from previous day: 07/10 0701 - 07/11 0700 In: 720 [P.O.:720] Out: 2220 [Urine:2220] Intake/Output this shift:    PE: Gen:  Alert, NAD, pleasant Card:  RRR, no M/G/R heard Pulm:  CTA, no W/R/R Abd: Soft, appropriately tender, ND, +BS, no HSM, incisions C/D/I.   Lab Results:   Recent Labs  05/27/16 1935 05/28/16 0403  WBC 10.3 8.1  HGB 11.8* 10.7*  HCT 37.4 34.1*  PLT 263 245   BMET  Recent Labs  05/27/16 1935 05/28/16 0403  NA 139 137  K 3.4* 3.8  CL 106 108  CO2 23 25  GLUCOSE 95 94  BUN 15 12  CREATININE 0.93 0.78  CALCIUM 9.6 9.0   PT/INR No results for input(s): LABPROT, INR in the last 72 hours. CMP     Component Value Date/Time   NA 137 05/28/2016 0403   K 3.8 05/28/2016 0403   CL 108 05/28/2016 0403   CO2 25 05/28/2016 0403   GLUCOSE 94 05/28/2016 0403   BUN 12 05/28/2016 0403   CREATININE 0.78 05/28/2016 0403   CALCIUM 9.0 05/28/2016 0403   PROT 6.6 05/28/2016 0403   ALBUMIN 2.9* 05/28/2016 0403   AST 135* 05/28/2016 0403   ALT 91* 05/28/2016 0403   ALKPHOS 78 05/28/2016 0403   BILITOT 0.8 05/28/2016 0403   GFRNONAA >60 05/28/2016 0403   GFRAA >60 05/28/2016 0403   Lipase     Component Value Date/Time   LIPASE 19 05/27/2016 1935       Studies/Results: Ct Abdomen Pelvis W Contrast  05/28/2016  CLINICAL DATA:  Postop laparoscopic cholecystectomy, abdominal distention, pelvic mass EXAM: CT ABDOMEN AND PELVIS WITH CONTRAST TECHNIQUE: Multidetector CT imaging  of the abdomen and pelvis was performed using the standard protocol following bolus administration of intravenous contrast. CONTRAST:  118mL ISOVUE-300 IOPAMIDOL (ISOVUE-300) INJECTION 61% COMPARISON:  None. FINDINGS: Lower chest:  Lung bases are clear. Hepatobiliary: Liver is within normal limits. No suspicious/enhancing hepatic lesions. Status post cholecystectomy. Trace fluid in the surgical bed (series 2/ image 22), compatible with a postoperative seroma. No intrahepatic or extrahepatic ductal dilatation. Pancreas: Within normal limits. Spleen: Within normal limits. Adrenals/Urinary Tract: Adrenal glands are within normal limits. Kidneys are within normal limits.  No hydronephrosis. Bladder is mildly thick-walled although underdistended. Stomach/Bowel: Stomach is within normal limits. No evidence of bowel obstruction. Normal appendix (series 2/ image 48). Scattered colonic diverticulosis, without evidence of diverticulitis. Vascular/Lymphatic: No evidence of abdominal aortic aneurysm. No suspicious abdominopelvic lymphadenopathy. Reproductive: Enlarged uterus with multiple uterine fibroids, including a dominant 6.4 cm intramural fibroid in the right uterine fundus (series 2/ image 69). Left ovary is within normal limits.  No right adnexal mass. Other: No abdominopelvic ascites. Musculoskeletal: Mild degenerative changes of the visualized thoracolumbar spine. IMPRESSION: Status post cholecystectomy. Trace fluid in the surgical bed, compatible with a postoperative seroma. Enlarged, fibroid uterus, likely corresponding to the pelvic mass on laparoscopy. Electronically Signed   By: Julian Hy M.D.   On: 05/28/2016 17:49  Anti-infectives: Anti-infectives    Start     Dose/Rate Route Frequency Ordered Stop   05/28/16 2200  cefTRIAXone (ROCEPHIN) 2 g in dextrose 5 % 50 mL IVPB  Status:  Discontinued     2 g 100 mL/hr over 30 Minutes Intravenous Every 24 hours 05/28/16 0025 05/29/16 0734   05/27/16  2130  cefTRIAXone (ROCEPHIN) 2 g in dextrose 5 % 50 mL IVPB     2 g 100 mL/hr over 30 Minutes Intravenous  Once 05/27/16 2127 05/27/16 2247     Assessment/Plan Acute cholecystitis POD #2 lap chole by Dr. Donne Hazel  FEN: regular diet ID: Rocephin perioperatively.  DVT Proph: Plan: discharge today - - tolerating PO, ambulating, pain controlled, + flatus     Kelly Ward , [facility] 05/30/2016, 8:10 AM Pager: [phone] Consults: [phone] Mon-Fri 7:00 am-4:30 pm Sat-Sun 7:00 am-11:30 am

## 2016-06-13 ENCOUNTER — Other Ambulatory Visit: Payer: Self-pay | Admitting: Internal Medicine

## 2016-06-13 DIAGNOSIS — Z1231 Encounter for screening mammogram for malignant neoplasm of breast: Secondary | ICD-10-CM

## 2016-06-15 ENCOUNTER — Ambulatory Visit
Admission: RE | Admit: 2016-06-15 | Discharge: 2016-06-15 | Disposition: A | Discharge status: Home / Self Care | Payer: BLUE CROSS/BLUE SHIELD | Source: Ambulatory Visit | Attending: Internal Medicine | Admitting: Internal Medicine

## 2016-06-15 DIAGNOSIS — Z1231 Encounter for screening mammogram for malignant neoplasm of breast: Secondary | ICD-10-CM

## 2018-08-27 ENCOUNTER — Other Ambulatory Visit: Payer: Self-pay | Admitting: Internal Medicine

## 2018-08-27 DIAGNOSIS — Z1231 Encounter for screening mammogram for malignant neoplasm of breast: Secondary | ICD-10-CM

## 2018-09-27 ENCOUNTER — Ambulatory Visit
Admission: RE | Admit: 2018-09-27 | Discharge: 2018-09-27 | Disposition: A | Discharge status: Home / Self Care | Payer: BLUE CROSS/BLUE SHIELD | Source: Ambulatory Visit | Attending: Internal Medicine | Admitting: Internal Medicine

## 2018-09-27 DIAGNOSIS — Z1231 Encounter for screening mammogram for malignant neoplasm of breast: Secondary | ICD-10-CM

## 2019-07-17 DIAGNOSIS — H401131 Primary open-angle glaucoma, bilateral, mild stage: Secondary | ICD-10-CM | POA: Diagnosis not present

## 2019-08-22 ENCOUNTER — Other Ambulatory Visit: Payer: Self-pay | Admitting: Internal Medicine

## 2019-08-22 DIAGNOSIS — Z1231 Encounter for screening mammogram for malignant neoplasm of breast: Secondary | ICD-10-CM

## 2019-08-27 ENCOUNTER — Emergency Department (HOSPITAL_COMMUNITY)
Admission: EM | Admit: 2019-08-27 | Discharge: 2019-08-27 | Disposition: A | Discharge status: Home / Self Care | Payer: BC Managed Care – PPO | Attending: Emergency Medicine | Admitting: Emergency Medicine

## 2019-08-27 ENCOUNTER — Emergency Department (HOSPITAL_COMMUNITY): Payer: BC Managed Care – PPO

## 2019-08-27 ENCOUNTER — Other Ambulatory Visit: Payer: Self-pay

## 2019-08-27 ENCOUNTER — Encounter (HOSPITAL_COMMUNITY): Payer: Self-pay

## 2019-08-27 DIAGNOSIS — R509 Fever, unspecified: Secondary | ICD-10-CM | POA: Diagnosis not present

## 2019-08-27 DIAGNOSIS — N939 Abnormal uterine and vaginal bleeding, unspecified: Secondary | ICD-10-CM | POA: Insufficient documentation

## 2019-08-27 DIAGNOSIS — R1030 Lower abdominal pain, unspecified: Secondary | ICD-10-CM | POA: Insufficient documentation

## 2019-08-27 DIAGNOSIS — Z20828 Contact with and (suspected) exposure to other viral communicable diseases: Secondary | ICD-10-CM | POA: Diagnosis not present

## 2019-08-27 DIAGNOSIS — R1031 Right lower quadrant pain: Secondary | ICD-10-CM | POA: Diagnosis not present

## 2019-08-27 DIAGNOSIS — Z79899 Other long term (current) drug therapy: Secondary | ICD-10-CM | POA: Insufficient documentation

## 2019-08-27 DIAGNOSIS — D259 Leiomyoma of uterus, unspecified: Secondary | ICD-10-CM | POA: Diagnosis not present

## 2019-08-27 DIAGNOSIS — R102 Pelvic and perineal pain: Secondary | ICD-10-CM | POA: Diagnosis not present

## 2019-08-27 LAB — COMPREHENSIVE METABOLIC PANEL
ALT: 25 U/L (ref 0–44)
AST: 21 U/L (ref 15–41)
Albumin: 3.6 g/dL (ref 3.5–5.0)
Alkaline Phosphatase: 49 U/L (ref 38–126)
Anion gap: 8 (ref 5–15)
BUN: 13 mg/dL (ref 6–20)
CO2: 25 mmol/L (ref 22–32)
Calcium: 8.6 mg/dL — ABNORMAL LOW (ref 8.9–10.3)
Chloride: 102 mmol/L (ref 98–111)
Creatinine, Ser: 1.12 mg/dL — ABNORMAL HIGH (ref 0.44–1.00)
GFR calc Af Amer: >60 mL/min (ref >60)
GFR calc non Af Amer: 56 mL/min — ABNORMAL LOW (ref >60)
Glucose, Bld: 102 mg/dL — ABNORMAL HIGH (ref 70–99)
Potassium: 4.1 mmol/L (ref 3.5–5.1)
Sodium: 135 mmol/L (ref 135–145)
Total Bilirubin: 1.1 mg/dL (ref 0.3–1.2)
Total Protein: 8 g/dL (ref 6.5–8.1)

## 2019-08-27 LAB — WET PREP, GENITAL
Clue Cells Wet Prep HPF POC: NONE SEEN
Sperm: NONE SEEN
Trich, Wet Prep: NONE SEEN
Yeast Wet Prep HPF POC: NONE SEEN

## 2019-08-27 LAB — CBC
HCT: 37 % (ref 36.0–46.0)
Hemoglobin: 11.6 g/dL — ABNORMAL LOW (ref 12.0–15.0)
MCH: 27.2 pg (ref 26.0–34.0)
MCHC: 31.4 g/dL (ref 30.0–36.0)
MCV: 86.7 fL (ref 80.0–100.0)
Platelets: 279 10*3/uL (ref 150–400)
RBC: 4.27 MIL/uL (ref 3.87–5.11)
RDW: 13 % (ref 11.5–15.5)
WBC: 15.1 10*3/uL — ABNORMAL HIGH (ref 4.0–10.5)
nRBC: 0 % (ref 0.0–0.2)

## 2019-08-27 LAB — LIPASE, BLOOD: Lipase: 18 U/L (ref 11–51)

## 2019-08-27 LAB — URINALYSIS, ROUTINE W REFLEX MICROSCOPIC
Bilirubin Urine: NEGATIVE
Glucose, UA: NEGATIVE mg/dL
Ketones, ur: NEGATIVE mg/dL
Leukocytes,Ua: NEGATIVE
Nitrite: NEGATIVE
Protein, ur: NEGATIVE mg/dL
Specific Gravity, Urine: >1.046 — ABNORMAL HIGH (ref 1.005–1.030)
pH: 5 (ref 5.0–8.0)

## 2019-08-27 LAB — LACTIC ACID, PLASMA: Lactic Acid, Venous: 0.9 mmol/L (ref 0.5–1.9)

## 2019-08-27 LAB — I-STAT BETA HCG BLOOD, ED (MC, WL, AP ONLY): I-stat hCG, quantitative: <5 m[IU]/mL (ref <5)

## 2019-08-27 LAB — SARS CORONAVIRUS 2 BY RT PCR (HOSPITAL ORDER, PERFORMED IN ~~LOC~~ HOSPITAL LAB): SARS Coronavirus 2: NEGATIVE

## 2019-08-27 MED ORDER — IOHEXOL 300 MG/ML  SOLN
100.0000 mL | Freq: Once | INTRAMUSCULAR | Status: AC | PRN
  Administered 2019-08-27: 100 mL via INTRAVENOUS

## 2019-08-27 MED ORDER — SODIUM CHLORIDE 0.9 % IV BOLUS
1000.0000 mL | Freq: Once | INTRAVENOUS | Status: AC
  Administered 2019-08-27: 1000 mL via INTRAVENOUS

## 2019-08-27 MED ORDER — MORPHINE SULFATE (PF) 4 MG/ML IV SOLN
4.0000 mg | Freq: Once | INTRAVENOUS | Status: AC
  Administered 2019-08-27: 4 mg via INTRAVENOUS
  Filled 2019-08-27: qty 1

## 2019-08-27 MED ORDER — HYDROCODONE-ACETAMINOPHEN 5-325 MG PO TABS: 1.0000 | ORAL_TABLET | Freq: Four times a day (QID) | ORAL | 0 refills | Status: DC | PRN

## 2019-08-27 MED ORDER — OXYCODONE-ACETAMINOPHEN 5-325 MG PO TABS
1.0000 | ORAL_TABLET | ORAL | Status: DC | PRN
  Administered 2019-08-27: 1 via ORAL
  Filled 2019-08-27: qty 1

## 2019-08-27 MED ORDER — HYDROCODONE-ACETAMINOPHEN 5-325 MG PO TABS
1.0000 | ORAL_TABLET | Freq: Once | ORAL | Status: AC
  Administered 2019-08-27: 1 via ORAL
  Filled 2019-08-27: qty 1

## 2019-08-27 MED ORDER — KETOROLAC TROMETHAMINE 30 MG/ML IJ SOLN
30.0000 mg | Freq: Once | INTRAMUSCULAR | Status: AC
  Administered 2019-08-27: 30 mg via INTRAVENOUS
  Filled 2019-08-27: qty 1

## 2019-08-27 MED ORDER — ONDANSETRON HCL 4 MG/2ML IJ SOLN
4.0000 mg | Freq: Once | INTRAMUSCULAR | Status: AC
  Administered 2019-08-27: 4 mg via INTRAVENOUS
  Filled 2019-08-27: qty 2

## 2019-08-27 MED ORDER — IBUPROFEN 600 MG PO TABS: 600.0000 mg | ORAL_TABLET | Freq: Four times a day (QID) | ORAL | 0 refills | Status: DC | PRN

## 2019-08-27 MED ORDER — ACETAMINOPHEN 325 MG PO TABS
650.0000 mg | ORAL_TABLET | Freq: Once | ORAL | Status: AC
  Administered 2019-08-27: 650 mg via ORAL
  Filled 2019-08-27: qty 2

## 2019-08-27 NOTE — ED Notes (Signed)
Patient transported to US 

## 2019-08-27 NOTE — Discharge Instructions (Signed)
Please take ibuprofen 600 mg every 6 hours to help with bleeding and pain, use Norco as needed for breakthrough pain.  The careful as this medication can cause some drowsiness.  Your work-up today was otherwise reassuring, I am unsure what caused to the fever you had today your COVID test was negative, chest x-ray and abdominal scans look good and lab work overall is reassuring.  If you have continued fevers or develop any new or concerning symptoms please return for reevaluation.  If you begin having heavier bleeding feel lightheaded or pass out, have worsening abdominal pain or any other new or concerning symptoms return to the ED.  Otherwise I would like for you to call to schedule a follow-up with OB/GYN in the next week regarding uterine fibroids, this explains the bleeding and lower abdominal pain you have been having.

## 2019-08-27 NOTE — ED Notes (Signed)
Patient transported to CT 

## 2019-08-27 NOTE — ED Provider Notes (Signed)
[facility] EMERGENCY DEPARTMENT Provider Note   CSN: [id number] Arrival date & time: 08/27/19  1026      History   Chief Complaint Chief Complaint  Patient presents with   Vaginal Bleeding    HPI Kelly Ward is a 53 y.o. female.     Kelly Ward is a 53 y.o. female with a history of morbid obesity and glaucoma, who presents to the emergency department for evaluation of vaginal bleeding and abdominal pain.  Symptoms started Friday 10/2.  Patient initially thought this was her typical menstrual cycle just starting a few days early, but pain became increasingly intense, far worse than her typical menstrual cycles.  She reports pain has primarily been located across the lower abdomen, worse in the right lower quadrant, but reports that pain is now starting to radiate into the upper abdomen.  She reports heavy vaginal bleeding, she passed some clots the first 2 days, but reports she has had to change her pad or super tampon almost every 2 hours.  She denies any vaginal discharge, last sexually active about a month and a half ago, no new sexual partners.  Denies dysuria or urinary frequency.  No diarrhea or blood in the stools, no constipation.  She denies any chest pain, shortness of breath or cough.  Reports she has had chills over the past few days but has not noted a fever at home, febrile to 101.6 here.  She denies any sick contacts.  She has not had any vomiting or nausea but reports decreased appetite today.  No medications prior to arrival.  No other aggravating or alleviating factors.     Past Medical History:  Diagnosis Date   Glaucoma    both eyes   Severe obesity (BMI >= 40) ([place]) 05/29/2016   bmi 54    Patient Active Problem List   Diagnosis Date Noted   Severe obesity (BMI >= 40) ([place]) 05/29/2016   Acute cholecystitis 05/27/2016    Past Surgical History:  Procedure Laterality Date   CHOLECYSTECTOMY N/A 05/28/2016   Procedure: LAPAROSCOPIC  CHOLECYSTECTOMY ;  Surgeon: Rolm Bookbinder, MD;  Location: [facility];  Service: General;  Laterality: N/A;   COLONOSCOPY WITH PROPOFOL N/A 12/16/2015   Procedure: COLONOSCOPY WITH PROPOFOL;  Surgeon: Juanita Craver, MD;  Location: [facility];  Service: Endoscopy;  Laterality: N/A;   NO PAST SURGERIES       OB History   No obstetric history on file.      Home Medications    Prior to Admission medications   Medication Sig Start Date End Date Taking? Authorizing Provider  calcium carbonate (TUMS - DOSED IN MG ELEMENTAL CALCIUM) 500 MG chewable tablet Chew 2 tablets by mouth daily as needed for indigestion or heartburn.    [provider]  COMBIGAN 0.2-0.5 % ophthalmic solution INSTILL 1 DROP IN [facility] AFFECTED EYE EVERY 12 HOURS 11/18/15   [provider]  diphenhydrAMINE (BENADRYL) 25 mg capsule Take 25 mg by mouth every 6 (six) hours as needed for itching, allergies or sleep.    [provider]  HYDROcodone-acetaminophen (NORCO) 5-325 MG tablet Take 1 tablet by mouth every 6 (six) hours as needed. 08/27/19   Jacqlyn Larsen, PA-C  ibuprofen (ADVIL) 600 MG tablet Take 1 tablet (600 mg total) by mouth every 6 (six) hours as needed. 08/27/19   Jacqlyn Larsen, PA-C  Multiple Vitamin (MULTIVITAMIN) LIQD Take 5 mLs by mouth daily. TLC Brand    [provider]  oxyCODONE-acetaminophen (PERCOCET/ROXICET) 5-325 MG tablet Take 1-2 tablets by mouth every 4 (four) hours as needed for moderate pain. 05/29/16   Earnstine Regal, PA-C  simethicone (MYLICON) 80 MG chewable tablet Chew 1 tablet (80 mg total) by mouth 4 (four) times daily as needed for flatulence. 05/30/16   Jill Alexanders, PA-C  topiramate (TOPAMAX) 100 MG tablet Take 100 mg by mouth daily. 10/02/15   [provider]    Family History Family History  Problem Relation Age of Onset   Breast cancer Sister     Social History Social History   Tobacco Use   Smoking status: Never Smoker    Smokeless tobacco: Never Used  Substance Use Topics   Alcohol use: No   Drug use: No     Allergies   Peanut-containing drug products   Review of Systems Review of Systems  Constitutional: Positive for chills and fever.  HENT: Negative.   Eyes: Negative for visual disturbance.  Respiratory: Negative for cough and shortness of breath.   Cardiovascular: Negative for chest pain.  Gastrointestinal: Positive for abdominal pain. Negative for blood in stool, constipation, diarrhea, nausea and vomiting.  Genitourinary: Positive for pelvic pain and vaginal bleeding. Negative for dysuria, flank pain and frequency.  Musculoskeletal: Negative for arthralgias and myalgias.  Skin: Negative for color change, pallor and rash.  Neurological: Negative for dizziness, syncope and light-headedness.     Physical Exam Updated Vital Signs BP 129/68 (BP Location: Right Arm)    Pulse 87    Temp (!) 101.6 F (38.7 C) (Oral)    Resp 18    SpO2 99%   Physical Exam Vitals signs and nursing note reviewed. Exam conducted with a chaperone present.  Constitutional:      General: She is not in acute distress.    Appearance: She is well-developed. She is not diaphoretic.     Comments: Obese female, nontoxic, no acute distress  HENT:     Head: Normocephalic and atraumatic.     Mouth/Throat:     Mouth: Mucous membranes are moist.     Pharynx: Oropharynx is clear.  Eyes:     General:        Right eye: No discharge.        Left eye: No discharge.     Pupils: Pupils are equal, round, and reactive to light.  Neck:     Musculoskeletal: Neck supple.  Cardiovascular:     Rate and Rhythm: Regular rhythm. Tachycardia present.     Heart sounds: Normal heart sounds. No murmur. No friction rub. No gallop.      Comments: Mild tachycardia with regular rhythm Pulmonary:     Effort: Pulmonary effort is normal. No respiratory distress.     Breath sounds: Normal breath sounds. No wheezing or rales.     Comments:  Respirations equal and unlabored, patient able to speak in full sentences, lungs clear to auscultation bilaterally, exam limited by body habitus Abdominal:     General: Bowel sounds are normal. There is no distension.     Palpations: Abdomen is soft. There is no mass.     Tenderness: There is no abdominal tenderness. There is no guarding.     Comments: Abdomen is soft and nondistended, bowel sounds are present throughout, patient has some generalized tenderness throughout the abdomen, although more severe across the lower abdomen and into the pelvis.  There is mild guarding across the lower abdomen, no rigidity.  No CVA tenderness bilaterally.  Genitourinary:  Comments: Chaperone present during pelvic exam. No external genital lesions noted. On speculum exam there is a very small amount of blood pooled in the vaginal vault, no clots present, no active bleeding from the cervical os On bimanual exam enlarged uterus palpable and tender, no focal adnexal masses palpated or adnexal tenderness Musculoskeletal:        General: No deformity.  Skin:    General: Skin is warm and dry.     Capillary Refill: Capillary refill takes less than 2 seconds.  Neurological:     Mental Status: She is alert and oriented to person, place, and time.     Coordination: Coordination normal.     Comments: Speech is clear, able to follow commands Moves extremities without ataxia, coordination intact  Psychiatric:        Mood and Affect: Mood normal.        Behavior: Behavior normal.      ED Treatments / Results  Labs (all labs ordered are listed, but only abnormal results are displayed) Labs Reviewed  URINE CULTURE - Abnormal; Notable for the following components:      Result Value   Culture MULTIPLE SPECIES PRESENT, SUGGEST RECOLLECTION (*)    All other components within normal limits  WET PREP, GENITAL - Abnormal; Notable for the following components:   WBC, Wet Prep HPF POC FEW (*)    All other  components within normal limits  CBC - Abnormal; Notable for the following components:   WBC 15.1 (*)    Hemoglobin 11.6 (*)    All other components within normal limits  COMPREHENSIVE METABOLIC PANEL - Abnormal; Notable for the following components:   Glucose, Bld 102 (*)    Creatinine, Ser 1.12 (*)    Calcium 8.6 (*)    GFR calc non Af Amer 56 (*)    All other components within normal limits  URINALYSIS, ROUTINE W REFLEX MICROSCOPIC - Abnormal; Notable for the following components:   Specific Gravity, Urine >1.046 (*)    Hgb urine dipstick LARGE (*)    Bacteria, UA RARE (*)    All other components within normal limits  RPR - Abnormal; Notable for the following components:   RPR Ser Ql Reactive (*)    All other components within normal limits  CULTURE, BLOOD (ROUTINE X 2)  CULTURE, BLOOD (ROUTINE X 2)  SARS CORONAVIRUS 2 (HOSPITAL ORDER, [facility])  LIPASE, BLOOD  LACTIC ACID, PLASMA  T.PALLIDUM AB, TOTAL  I-STAT BETA HCG BLOOD, ED ([facility], [facility], [facility] ONLY)  GC/CHLAMYDIA PROBE AMP ([place]) NOT AT [facility]    EKG None  Radiology US Transvaginal Non-ob  Result Date: 08/27/2019 CLINICAL DATA:  Vaginal bleeding EXAM: TRANSABDOMINAL AND TRANSVAGINAL ULTRASOUND OF PELVIS DOPPLER ULTRASOUND OF OVARIES TECHNIQUE: Both transabdominal and transvaginal ultrasound examinations of the pelvis were performed. Transabdominal technique was performed for global imaging of the pelvis including uterus, ovaries, adnexal regions, and pelvic cul-de-sac. It was necessary to proceed with endovaginal exam following the transabdominal exam to visualize the endometrial and ovaries. Color and duplex Doppler ultrasound was utilized to evaluate blood flow to the ovaries. COMPARISON:  CT abdomen 08/27/2019 FINDINGS: Uterus Measurements: 13.8 x 13.1 x 12.6 cm = volume: 1189.9 mL. Uterus is difficult to penetrate with a heterogeneous overall appearance. Largest hypoechoic uterine mass measures  12.7 x 13 cm consistent with a fibroid. 1.5 cm hypoechoic mass near the cervix likely reflecting a fibroid as well. Endometrium Difficult to visualize. Right ovary Measurements: 4.3  x 3 x 3.6 cm = volume: 24.2 mL. Normal appearance/no adnexal mass. Left ovary Not visualized. Pulsed Doppler evaluation of the right ovary demonstrates normal low-resistance arterial and venous waveforms. Other findings No abnormal free fluid. IMPRESSION: 1. No right ovarian torsion. 2. Left ovary is not visualized. 3. Enlarged fibroid uterus. Consider further evaluation with sonohysterogram for confirmation prior to hysteroscopy. Endometrial sampling should also be considered if patient is at high risk for endometrial carcinoma. (Ref: Radiological Reasoning: Algorithmic Workup of Abnormal Vaginal Bleeding with Endovaginal Sonography and Sonohysterography. AJR 2008; [id number]) Electronically Signed   By: Kathreen Devoid   On: 08/27/2019 19:18   US Pelvis Complete  Result Date: 08/27/2019 CLINICAL DATA:  Vaginal bleeding EXAM: TRANSABDOMINAL AND TRANSVAGINAL ULTRASOUND OF PELVIS DOPPLER ULTRASOUND OF OVARIES TECHNIQUE: Both transabdominal and transvaginal ultrasound examinations of the pelvis were performed. Transabdominal technique was performed for global imaging of the pelvis including uterus, ovaries, adnexal regions, and pelvic cul-de-sac. It was necessary to proceed with endovaginal exam following the transabdominal exam to visualize the endometrial and ovaries. Color and duplex Doppler ultrasound was utilized to evaluate blood flow to the ovaries. COMPARISON:  CT abdomen 08/27/2019 FINDINGS: Uterus Measurements: 13.8 x 13.1 x 12.6 cm = volume: 1189.9 mL. Uterus is difficult to penetrate with a heterogeneous overall appearance. Largest hypoechoic uterine mass measures 12.7 x 13 cm consistent with a fibroid. 1.5 cm hypoechoic mass near the cervix likely reflecting a fibroid as well. Endometrium Difficult to visualize. Right ovary  Measurements: 4.3 x 3 x 3.6 cm = volume: 24.2 mL. Normal appearance/no adnexal mass. Left ovary Not visualized. Pulsed Doppler evaluation of the right ovary demonstrates normal low-resistance arterial and venous waveforms. Other findings No abnormal free fluid. IMPRESSION: 1. No right ovarian torsion. 2. Left ovary is not visualized. 3. Enlarged fibroid uterus. Consider further evaluation with sonohysterogram for confirmation prior to hysteroscopy. Endometrial sampling should also be considered if patient is at high risk for endometrial carcinoma. (Ref: Radiological Reasoning: Algorithmic Workup of Abnormal Vaginal Bleeding with Endovaginal Sonography and Sonohysterography. AJR 2008; [id number]) Electronically Signed   By: Kathreen Devoid   On: 08/27/2019 19:18   Ct Abdomen Pelvis W Contrast  Result Date: 08/27/2019 CLINICAL DATA:  Right-sided lower abdominal pain with vaginal bleeding. EXAM: CT ABDOMEN AND PELVIS WITH CONTRAST TECHNIQUE: Multidetector CT imaging of the abdomen and pelvis was performed using the standard protocol following bolus administration of intravenous contrast. CONTRAST:  134mL OMNIPAQUE IOHEXOL 300 MG/ML  SOLN COMPARISON:  CT abdomen pelvis dated May 28, 2016. FINDINGS: Lower chest: No acute abnormality. Hepatobiliary: No focal liver abnormality is seen. Status post cholecystectomy. No biliary dilatation. Pancreas: Unremarkable. No pancreatic ductal dilatation or surrounding inflammatory changes. Spleen: Normal in size without focal abnormality. Adrenals/Urinary Tract: Adrenal glands are unremarkable. Kidneys are normal, without renal calculi, focal lesion, or hydronephrosis. Bladder is unremarkable for the degree of distention. Stomach/Bowel: Stomach is within normal limits. Appendix appears normal. No evidence of bowel wall thickening, distention, or inflammatory changes. Mild colonic diverticulosis. Vascular/Lymphatic: No significant vascular findings are present. Prominent bilateral  gonadal veins. No enlarged abdominal or pelvic lymph nodes. Reproductive: Relatively unchanged bulky, fibroid uterus. No adnexal mass. Other: No abdominal wall hernia or abnormality. No abdominopelvic ascites. No pneumoperitoneum. Musculoskeletal: No acute or significant osseous findings. IMPRESSION: 1.  No acute intra-abdominal process. 2. Relatively unchanged bulky, fibroid uterus. Electronically Signed   By: Titus Dubin M.D.   On: 08/27/2019 18:11   [place] Art/ven Flow Abd Pelv Doppler  Result Date:  08/27/2019 CLINICAL DATA:  Vaginal bleeding EXAM: TRANSABDOMINAL AND TRANSVAGINAL ULTRASOUND OF PELVIS DOPPLER ULTRASOUND OF OVARIES TECHNIQUE: Both transabdominal and transvaginal ultrasound examinations of the pelvis were performed. Transabdominal technique was performed for global imaging of the pelvis including uterus, ovaries, adnexal regions, and pelvic cul-de-sac. It was necessary to proceed with endovaginal exam following the transabdominal exam to visualize the endometrial and ovaries. Color and duplex Doppler ultrasound was utilized to evaluate blood flow to the ovaries. COMPARISON:  CT abdomen 08/27/2019 FINDINGS: Uterus Measurements: 13.8 x 13.1 x 12.6 cm = volume: 1189.9 mL. Uterus is difficult to penetrate with a heterogeneous overall appearance. Largest hypoechoic uterine mass measures 12.7 x 13 cm consistent with a fibroid. 1.5 cm hypoechoic mass near the cervix likely reflecting a fibroid as well. Endometrium Difficult to visualize. Right ovary Measurements: 4.3 x 3 x 3.6 cm = volume: 24.2 mL. Normal appearance/no adnexal mass. Left ovary Not visualized. Pulsed Doppler evaluation of the right ovary demonstrates normal low-resistance arterial and venous waveforms. Other findings No abnormal free fluid. IMPRESSION: 1. No right ovarian torsion. 2. Left ovary is not visualized. 3. Enlarged fibroid uterus. Consider further evaluation with sonohysterogram for confirmation prior to hysteroscopy.  Endometrial sampling should also be considered if patient is at high risk for endometrial carcinoma. (Ref: Radiological Reasoning: Algorithmic Workup of Abnormal Vaginal Bleeding with Endovaginal Sonography and Sonohysterography. AJR 2008; [id number]) Electronically Signed   By: Kathreen Devoid   On: 08/27/2019 19:18   Dg Chest Port 1 View  Result Date: 08/27/2019 CLINICAL DATA:  Fever today. Lower abdominal pain and vaginal bleeding x5 days.fever EXAM: PORTABLE CHEST 1 VIEW COMPARISON:  None. FINDINGS: Normal mediastinum and cardiac silhouette. Normal pulmonary vasculature. No evidence of effusion, infiltrate, or pneumothorax. No acute bony abnormality. IMPRESSION: Normal chest radiograph. Electronically Signed   By: Suzy Bouchard M.D.   On: 08/27/2019 17:10    Procedures Procedures (including critical care time)  Medications Ordered in ED Medications  sodium chloride 0.9 % bolus 1,000 mL (0 mLs Intravenous Stopped 08/27/19 2004)  ondansetron (ZOFRAN) injection 4 mg (4 mg Intravenous Given 08/27/19 1610)  morphine 4 MG/ML injection 4 mg (4 mg Intravenous Given 08/27/19 1611)  acetaminophen (TYLENOL) tablet 650 mg (650 mg Oral Given 08/27/19 1555)  iohexol (OMNIPAQUE) 300 MG/ML solution 100 mL (100 mLs Intravenous Contrast Given 08/27/19 1724)  ketorolac (TORADOL) 30 MG/ML injection 30 mg (30 mg Intravenous Given 08/27/19 2002)  HYDROcodone-acetaminophen (NORCO/VICODIN) 5-325 MG per tablet 1 tablet (1 tablet Oral Given 08/27/19 2003)     Initial Impression / Assessment and Plan / ED Course  I have reviewed the triage vital signs and the nursing notes.  Pertinent labs & imaging results that were available during my care of the patient were reviewed by me and considered in my medical decision making (see chart for details).  53 year old female presents with lower abdominal pain and vaginal bleeding over the past 5 days, on arrival noted to be febrile, patient denies fevers at home.  She is also  mildly tachycardic on arrival, normotensive, no shortness of breath or tachypnea.  On exam she has generalized tenderness throughout the abdomen, worse across the lower abdomen.  Pelvic exam with small amount of bleeding, no discharge.  Patient has not been recently sexually active and is not high risk for PID or pelvic infection.  Given fever suspect this could potentially be coming from intra-abdominal source or urinary tract infection.  We will also check chest x-ray and COVID test.  CT abdomen  pelvis as well as pelvic ultrasound ordered.  Patient given IV fluids, and pain medication, Tylenol for fever.  Labs show a leukocytosis of 15.1, stable hemoglobin, slightly elevated creatinine but no significant electrolyte derangements, normal renal and liver function and normal lipase.  Wet prep with few WBCs but no other abnormalities.  Urinalysis with some RBCs and rare bacteria but no other signs of infection.  STD testing collected.  Lactic acid is not elevated, blood cultures pending.  COVID negative.  Chest x-ray is clear.  CT abdomen pelvis without acute abnormality, pelvic ultrasound shows a enlarged fibroid uterus which certainly explains patient's vaginal bleeding and pain, will have her follow-up with OB/GYN regarding this we will start her on a course of NSAIDs which should hopefully help with both bleeding and discomfort.   Unclear etiology of patient's fever, but has come down and all vitals are normal.  She reports she is feeling improved, discussed with Dr. Darl Householder and at this time feel patient is stable for discharge home, she does have blood cultures pending, strict return precautions discussed.  PCP follow-up and OB/GYN follow-up encouraged.  Patient expresses understanding and agreement with this plan.  Discharged home in good condition.  Final Clinical Impressions(s) / ED Diagnoses   Final diagnoses:  Abnormal vaginal bleeding  Uterine leiomyoma, unspecified location  Lower abdominal pain    Fever, unspecified fever cause    ED Discharge Orders         Ordered    ibuprofen (ADVIL) 600 MG tablet  Every 6 hours PRN     08/27/19 1957    HYDROcodone-acetaminophen (NORCO) 5-325 MG tablet  Every 6 hours PRN     08/27/19 1957           Jacqlyn Larsen, PA-C 08/30/19 1559    Drenda Freeze, MD 09/05/19 [phone]

## 2019-08-27 NOTE — ED Triage Notes (Signed)
Patient reports that she has had increasing lower abdominal pain with heavy vaginal bleeding since Friday. States that she normally has regular cycles with no complications. No other associated symptoms

## 2019-08-27 NOTE — ED Notes (Signed)
Urine collected in triage.

## 2019-08-28 LAB — URINE CULTURE

## 2019-08-28 LAB — RPR
RPR Ser Ql: REACTIVE — AB
RPR Titer: NONREACTIVE

## 2019-08-29 LAB — GC/CHLAMYDIA PROBE AMP (~~LOC~~) NOT AT ARMC
Chlamydia: NEGATIVE
Neisseria Gonorrhea: NEGATIVE

## 2019-08-29 LAB — T.PALLIDUM AB, TOTAL: T Pallidum Abs: NONREACTIVE

## 2019-09-01 LAB — CULTURE, BLOOD (ROUTINE X 2)
Culture: NO GROWTH
Culture: NO GROWTH
Special Requests: ADEQUATE

## 2019-09-18 DIAGNOSIS — D252 Subserosal leiomyoma of uterus: Secondary | ICD-10-CM | POA: Diagnosis not present

## 2019-09-18 DIAGNOSIS — N939 Abnormal uterine and vaginal bleeding, unspecified: Secondary | ICD-10-CM | POA: Diagnosis not present

## 2019-09-20 DIAGNOSIS — Z23 Encounter for immunization: Secondary | ICD-10-CM | POA: Diagnosis not present

## 2019-10-03 ENCOUNTER — Ambulatory Visit
Admission: RE | Admit: 2019-10-03 | Discharge: 2019-10-03 | Disposition: A | Discharge status: Home / Self Care | Payer: BC Managed Care – PPO | Source: Ambulatory Visit | Attending: Internal Medicine | Admitting: Internal Medicine

## 2019-10-03 ENCOUNTER — Other Ambulatory Visit: Payer: Self-pay

## 2019-10-03 DIAGNOSIS — Z1231 Encounter for screening mammogram for malignant neoplasm of breast: Secondary | ICD-10-CM

## 2019-10-07 DIAGNOSIS — Z0000 Encounter for general adult medical examination without abnormal findings: Secondary | ICD-10-CM | POA: Diagnosis not present

## 2019-10-07 DIAGNOSIS — R7309 Other abnormal glucose: Secondary | ICD-10-CM | POA: Diagnosis not present

## 2019-10-07 DIAGNOSIS — E559 Vitamin D deficiency, unspecified: Secondary | ICD-10-CM | POA: Diagnosis not present

## 2019-10-07 DIAGNOSIS — Z0001 Encounter for general adult medical examination with abnormal findings: Secondary | ICD-10-CM | POA: Diagnosis not present

## 2019-10-07 DIAGNOSIS — N920 Excessive and frequent menstruation with regular cycle: Secondary | ICD-10-CM | POA: Diagnosis not present

## 2019-10-07 DIAGNOSIS — R5383 Other fatigue: Secondary | ICD-10-CM | POA: Diagnosis not present

## 2019-11-12 ENCOUNTER — Other Ambulatory Visit: Payer: Self-pay | Admitting: Obstetrics and Gynecology

## 2019-11-19 NOTE — Patient Instructions (Addendum)
DUE TO COVID-19 ONLY ONE VISITOR IS ALLOWED TO COME WITH YOU AND STAY IN THE WAITING ROOM ONLY DURING PRE OP AND PROCEDURE DAY OF SURGERY. THE 1 VISITOR MAY VISIT WITH YOU AFTER SURGERY IN YOUR PRIVATE ROOM DURING VISITING HOURS ONLY!  YOU NEED TO HAVE A COVID 19 TEST ON:11/24/19 @  8:30 PM   , THIS TEST MUST BE DONE BEFORE SURGERY, COME  [place], [address] , [id number].  ([place]) ONCE YOUR COVID TEST IS COMPLETED, PLEASE BEGIN THE QUARANTINE INSTRUCTIONS AS OUTLINED IN YOUR HANDOUT.                Kelly Ward    Your procedure is scheduled on: 11/27/2019   Report to [facility] Main  Entrance    Report to short stay at: 5:30 AM    Call this number if you have problems the morning of surgery [phone]    Remember: Do not eat food or drink liquids :After Midnight.   BRUSH YOUR TEETH MORNING OF SURGERY AND RINSE YOUR MOUTH OUT, NO CHEWING GUM CANDY OR MINTS.     Take these medicines the morning of surgery with A SIP OF WATER: Loratadine. Eye drops.                                  You may not have any metal on your body including hair pins and              piercings  Do not wear jewelry, make-up, lotions, powders or perfumes, deodorant             Do not wear nail polish on your fingernails.  Do not shave  48 hours prior to surgery.                Do not bring valuables to the hospital. [place].  Contacts, dentures or bridgework may not be worn into surgery.  Leave suitcase in the car. After surgery it may be brought to your room.      _____________________________________________________________________  .[facility] - Preparing for Surgery Before surgery, you can play an important role.  Because skin is not sterile, your skin needs to be as free of germs as possible.  You can reduce the number of germs on your skin by washing with CHG (chlorahexidine gluconate) soap before  surgery.  CHG is an antiseptic cleaner which kills germs and bonds with the skin to continue killing germs even after washing. Please DO NOT use if you have an allergy to CHG or antibacterial soaps.  If your skin becomes reddened/irritated stop using the CHG and inform your nurse when you arrive at Short Stay. Do not shave (including legs and underarms) for at least 48 hours prior to the first CHG shower.  You may shave your face/neck. Please follow these instructions carefully:  1.  Shower with CHG Soap the night before surgery and the  morning of Surgery.  2.  If you choose to wash your hair, wash your hair first as usual with your  normal  shampoo.  3.  After you shampoo, rinse your hair and body thoroughly to remove the  shampoo.  4.  Use CHG as you would any other liquid soap.  You can apply chg directly  to the skin and wash                       Gently with a scrungie or clean washcloth.  5.  Apply the CHG Soap to your body ONLY FROM THE NECK DOWN.   Do not use on face/ open                           Wound or open sores. Avoid contact with eyes, ears mouth and genitals (private parts).                       Wash face,  Genitals (private parts) with your normal soap.             6.  Wash thoroughly, paying special attention to the area where your surgery  will be performed.  7.  Thoroughly rinse your body with warm water from the neck down.  8.  DO NOT shower/wash with your normal soap after using and rinsing off  the CHG Soap.                9.  Pat yourself dry with a clean towel.            10.  Wear clean pajamas.            11.  Place clean sheets on your bed the night of your first shower and do not  sleep with pets. Day of Surgery : Do not apply any lotions/deodorants the morning of surgery.  Please wear clean clothes to the hospital/surgery center.  FAILURE TO FOLLOW THESE INSTRUCTIONS MAY RESULT IN THE CANCELLATION OF YOUR SURGERY PATIENT  SIGNATURE_________________________________  NURSE SIGNATURE__________________________________  ________________________________________________________________________

## 2019-11-20 ENCOUNTER — Other Ambulatory Visit: Payer: Self-pay

## 2019-11-20 ENCOUNTER — Encounter (HOSPITAL_COMMUNITY): Payer: Self-pay

## 2019-11-20 ENCOUNTER — Encounter (HOSPITAL_COMMUNITY)
Admission: RE | Admit: 2019-11-20 | Discharge: 2019-11-20 | Disposition: A | Discharge status: Home / Self Care | Payer: BC Managed Care – PPO | Source: Ambulatory Visit | Attending: Obstetrics and Gynecology | Admitting: Obstetrics and Gynecology

## 2019-11-20 DIAGNOSIS — Z01818 Encounter for other preprocedural examination: Secondary | ICD-10-CM | POA: Diagnosis not present

## 2019-11-20 HISTORY — DX: Anemia, unspecified: D64.9

## 2019-11-20 NOTE — Progress Notes (Addendum)
PCP - Willey Blade Cardiologist -   Chest x-ray - 10/7 EPIC EKG -  Stress Test -  ECHO -  Cardiac Cath -   Sleep Study - N/A CPAP -   Fasting Blood Sugar -  Checks Blood Sugar _____ times a day  Blood Thinner Instructions: Aspirin Instructions: Last Dose:  Anesthesia review:   Patient denies shortness of breath, fever, cough and chest pain at PAT appointment   Patient verbalized understanding of instructions that were given to them at the PAT appointment. Patient was also instructed that they will need to review over the PAT instructions again at home before surgery.

## 2019-11-24 ENCOUNTER — Other Ambulatory Visit (HOSPITAL_COMMUNITY)
Admission: RE | Admit: 2019-11-24 | Discharge: 2019-11-24 | Disposition: A | Discharge status: Home / Self Care | Payer: BC Managed Care – PPO | Source: Ambulatory Visit | Attending: Obstetrics and Gynecology | Admitting: Obstetrics and Gynecology

## 2019-11-24 ENCOUNTER — Other Ambulatory Visit (HOSPITAL_COMMUNITY): Payer: BC Managed Care – PPO

## 2019-11-24 DIAGNOSIS — Z20822 Contact with and (suspected) exposure to covid-19: Secondary | ICD-10-CM | POA: Insufficient documentation

## 2019-11-24 DIAGNOSIS — Z01812 Encounter for preprocedural laboratory examination: Secondary | ICD-10-CM | POA: Insufficient documentation

## 2019-11-25 ENCOUNTER — Encounter (HOSPITAL_COMMUNITY): Payer: Self-pay | Admitting: Physician Assistant

## 2019-11-25 ENCOUNTER — Encounter (HOSPITAL_COMMUNITY): Admission: RE | Admit: 2019-11-25 | Payer: BC Managed Care – PPO | Source: Ambulatory Visit

## 2019-11-25 LAB — NOVEL CORONAVIRUS, NAA (HOSP ORDER, SEND-OUT TO REF LAB; TAT 18-24 HRS): SARS-CoV-2, NAA: NOT DETECTED

## 2019-12-09 DIAGNOSIS — H401131 Primary open-angle glaucoma, bilateral, mild stage: Secondary | ICD-10-CM | POA: Diagnosis not present

## 2019-12-16 ENCOUNTER — Other Ambulatory Visit (HOSPITAL_COMMUNITY): Payer: BC Managed Care – PPO

## 2019-12-18 ENCOUNTER — Inpatient Hospital Stay
Admission: RE | Admit: 2019-12-18 | Payer: BC Managed Care – PPO | Source: Home / Self Care | Admitting: Obstetrics and Gynecology

## 2020-01-26 DIAGNOSIS — E669 Obesity, unspecified: Secondary | ICD-10-CM | POA: Diagnosis not present

## 2020-01-26 DIAGNOSIS — E8881 Metabolic syndrome: Secondary | ICD-10-CM | POA: Diagnosis not present

## 2020-02-14 ENCOUNTER — Ambulatory Visit: Payer: BC Managed Care – PPO | Attending: Internal Medicine

## 2020-02-14 DIAGNOSIS — Z23 Encounter for immunization: Secondary | ICD-10-CM

## 2020-02-14 NOTE — Progress Notes (Signed)
   Covid-19 Vaccination Clinic  Name:  Nali Farella    MRN: [id number] DOB: 29-Nov-1965  02/14/2020  Ms. Dewaele was observed post Covid-19 immunization for 15 minutes without incident. She was provided with Vaccine Information Sheet and instruction to access the V-Safe system.   Ms. Lunday was instructed to call 911 with any severe reactions post vaccine: Marland Kitchen Difficulty breathing  . Swelling of face and throat  . A fast heartbeat  . A bad rash all over body  . Dizziness and weakness   Immunizations Administered    Name Date Dose VIS Date Route   Pfizer COVID-19 Vaccine 02/14/2020 11:07 AM 0.3 mL 10/31/2019 Intramuscular   Manufacturer: [place]   Lot: [id number]   [place]: [id number]

## 2020-02-23 ENCOUNTER — Other Ambulatory Visit: Payer: Self-pay | Admitting: Obstetrics and Gynecology

## 2020-03-04 ENCOUNTER — Other Ambulatory Visit: Payer: Self-pay | Admitting: Obstetrics and Gynecology

## 2020-03-05 ENCOUNTER — Encounter (HOSPITAL_COMMUNITY): Payer: Self-pay

## 2020-03-05 ENCOUNTER — Other Ambulatory Visit: Payer: Self-pay

## 2020-03-05 ENCOUNTER — Encounter (HOSPITAL_COMMUNITY)
Admission: RE | Admit: 2020-03-05 | Discharge: 2020-03-05 | Disposition: A | Discharge status: Home / Self Care | Payer: BC Managed Care – PPO | Source: Ambulatory Visit | Attending: Obstetrics and Gynecology | Admitting: Obstetrics and Gynecology

## 2020-03-05 DIAGNOSIS — Z01812 Encounter for preprocedural laboratory examination: Secondary | ICD-10-CM | POA: Insufficient documentation

## 2020-03-05 DIAGNOSIS — Z79899 Other long term (current) drug therapy: Secondary | ICD-10-CM | POA: Diagnosis not present

## 2020-03-05 DIAGNOSIS — D259 Leiomyoma of uterus, unspecified: Secondary | ICD-10-CM | POA: Insufficient documentation

## 2020-03-05 DIAGNOSIS — N924 Excessive bleeding in the premenopausal period: Secondary | ICD-10-CM | POA: Insufficient documentation

## 2020-03-05 HISTORY — DX: Prediabetes: R73.03

## 2020-03-05 LAB — BASIC METABOLIC PANEL
Anion gap: 7 (ref 5–15)
BUN: 13 mg/dL (ref 6–20)
CO2: 26 mmol/L (ref 22–32)
Calcium: 9.2 mg/dL (ref 8.9–10.3)
Chloride: 105 mmol/L (ref 98–111)
Creatinine, Ser: 0.8 mg/dL (ref 0.44–1.00)
GFR calc Af Amer: >60 mL/min (ref >60)
GFR calc non Af Amer: >60 mL/min (ref >60)
Glucose, Bld: 87 mg/dL (ref 70–99)
Potassium: 4 mmol/L (ref 3.5–5.1)
Sodium: 138 mmol/L (ref 135–145)

## 2020-03-05 LAB — CBC
HCT: 40.1 % (ref 36.0–46.0)
Hemoglobin: 12.5 g/dL (ref 12.0–15.0)
MCH: 26.9 pg (ref 26.0–34.0)
MCHC: 31.2 g/dL (ref 30.0–36.0)
MCV: 86.2 fL (ref 80.0–100.0)
Platelets: 276 10*3/uL (ref 150–400)
RBC: 4.65 MIL/uL (ref 3.87–5.11)
RDW: 13 % (ref 11.5–15.5)
WBC: 7.2 10*3/uL (ref 4.0–10.5)
nRBC: 0 % (ref 0.0–0.2)

## 2020-03-05 LAB — TYPE AND SCREEN
ABO/RH(D): O POS
Antibody Screen: NEGATIVE

## 2020-03-05 LAB — ABO/RH: ABO/RH(D): O POS

## 2020-03-05 LAB — HEMOGLOBIN A1C
Hgb A1c MFr Bld: 5.4 % (ref 4.8–5.6)
Mean Plasma Glucose: 108.28 mg/dL

## 2020-03-05 LAB — GLUCOSE, CAPILLARY: Glucose-Capillary: 76 mg/dL (ref 70–99)

## 2020-03-05 NOTE — Pre-Procedure Instructions (Signed)
[id number] [place], [place] [id number]  [place] [phone] Phone: [phone] Fax: [phone]      Your procedure is scheduled on Friday, April 23rd..  Report to [facility] Main Entrance "A" at 11:30 A.M., and check in at the Admitting office.  Call this number if you have problems the morning of surgery:  [phone]  Call [phone] if you have any questions prior to your surgery date Monday-Friday 8am-4pm    Remember:  Do not eat or drink after midnight the night before your surgery   Take these medicines the morning of surgery with A SIP OF WATER   loratadine (CLARITIN) -as needed for allergies HYDROcodone-acetaminophen (NORCO/VICODIN)-as needed for pain     COMBIGAN 0.2-0.5 % ophthalmic solution  As of today, STOP taking any Aspirin (unless otherwise instructed by your surgeon) and Aspirin containing products, Aleve, Naproxen, Ibuprofen, Motrin, Advil, Goody's, BC's, all herbal medications, fish oil, and all vitamins.   WHAT DO I DO ABOUT MY DIABETES MEDICATION?   Marland Kitchen Do not take RYBELSUS the morning of surgery.   HOW TO MANAGE YOUR DIABETES BEFORE AND AFTER SURGERY  Why is it important to control my blood sugar before and after surgery? . Improving blood sugar levels before and after surgery helps healing and can limit problems. . A way of improving blood sugar control is eating a healthy diet by: o  Eating less sugar and carbohydrates o  Increasing activity/exercise o  Talking with your doctor about reaching your blood sugar goals . High blood sugars (greater than 180 mg/dL) can raise your risk of infections and slow your recovery, so you will need to focus on controlling your diabetes during the weeks before surgery. . Make sure that the doctor who takes care of your diabetes knows about your planned surgery including the date and location.  How do I manage my blood sugar  before surgery? . Check your blood sugar at least 4 times a day, starting 2 days before surgery, to make sure that the level is not too high or low. . Check your blood sugar the morning of your surgery when you wake up and every 2 hours until you get to the Short Stay unit. o If your blood sugar is less than 70 mg/dL, you will need to treat for low blood sugar: - Do not take insulin. - Treat a low blood sugar (less than 70 mg/dL) with  cup of clear juice (cranberry or apple), 4 glucose tablets, OR glucose gel. - Recheck blood sugar in 15 minutes after treatment (to make sure it is greater than 70 mg/dL). If your blood sugar is not greater than 70 mg/dL on recheck, call [phone] for further instructions. . Report your blood sugar to the short stay nurse when you get to Short Stay.  . If you are admitted to the hospital after surgery: o Your blood sugar will be checked by the staff and you will probably be given insulin after surgery (instead of oral diabetes medicines) to make sure you have good blood sugar levels. o The goal for blood sugar control after surgery is 80-180 mg/dL.                      Do not wear jewelry, make up, or nail polish            Do not wear lotions, powders, perfumes, or deodorant.  Do not shave 48 hours prior to surgery.              Do not bring valuables to the hospital.            [facility] is not responsible for any belongings or valuables.  Do NOT Smoke (Tobacco/Vappring) or drink Alcohol 24 hours prior to your procedure If you use a CPAP at night, you may bring all equipment for your overnight stay.   Contacts, glasses, dentures or bridgework may not be worn into surgery.      For patients admitted to the hospital, discharge time will be determined by your treatment team.   Patients discharged the day of surgery will not be allowed to drive home, and someone needs to stay with them for 24 hours.    Special instructions:   Kelly Ward-  Preparing For Surgery  Before surgery, you can play an important role. Because skin is not sterile, your skin needs to be as free of germs as possible. You can reduce the number of germs on your skin by washing with CHG (chlorahexidine gluconate) Soap before surgery.  CHG is an antiseptic cleaner which kills germs and bonds with the skin to continue killing germs even after washing.    Oral Hygiene is also important to reduce your risk of infection.  Remember - BRUSH YOUR TEETH THE MORNING OF SURGERY WITH YOUR REGULAR TOOTHPASTE  Please do not use if you have an allergy to CHG or antibacterial soaps. If your skin becomes reddened/irritated stop using the CHG.  Do not shave (including legs and underarms) for at least 48 hours prior to first CHG shower. It is OK to shave your face.  Please follow these instructions carefully.   1. Shower the NIGHT BEFORE SURGERY and the MORNING OF SURGERY with CHG Soap.   2. If you chose to wash your hair, wash your hair first as usual with your normal shampoo.  3. After you shampoo, rinse your hair and body thoroughly to remove the shampoo.  4. Use CHG as you would any other liquid soap. You can apply CHG directly to the skin and wash gently with a scrungie or a clean washcloth.   5. Apply the CHG Soap to your body ONLY FROM THE NECK DOWN.  Do not use on open wounds or open sores. Avoid contact with your eyes, ears, mouth and genitals (private parts). Wash Face and genitals (private parts)  with your normal soap.   6. Wash thoroughly, paying special attention to the area where your surgery will be performed.  7. Thoroughly rinse your body with warm water from the neck down.  8. DO NOT shower/wash with your normal soap after using and rinsing off the CHG Soap.  9. Pat yourself dry with a CLEAN TOWEL.  10. Wear CLEAN PAJAMAS to bed the night before surgery, wear comfortable clothes the morning of surgery  11. Place CLEAN SHEETS on your bed the night of  your first shower and DO NOT SLEEP WITH PETS.   Day of Surgery:   Do not apply any deodorants/lotions.  Please wear clean clothes to the hospital/surgery center.   Remember to brush your teeth WITH YOUR REGULAR TOOTHPASTE.   Please read over the following fact sheets that you were given.

## 2020-03-05 NOTE — Progress Notes (Signed)
PCP - Willey Blade Cardiologist - denies  PPM/ICD - N/A Device Orders -N/A  Rep Notified - N/A  Chest x-ray - N/A EKG - N/A Stress Test -denies  ECHO - denies Cardiac Cath - denies  Sleep Study - denies CPAP - denies  CBG at PAT 79. Pt states that she was told that she was pre-diabetic and did a 30 day prescription of Rybelsus per her PCP. Pt has not followed up with PCP since finishing that prescription. A1C collected today in PAT. Pt does not check CBG at home nor has the materials to do so.   ERAS Protcol -N/A PRE-SURGERY Ensure or G2-N/A   COVID TEST- Pt scheduled for Tuesday, 03/09/20.    Anesthesia review: No  Patient denies shortness of breath, fever, cough and chest pain at PAT appointment   All instructions explained to the patient, with a verbal understanding of the material. Patient agrees to go over the instructions while at home for a better understanding. Patient also instructed to self quarantine after being tested for COVID-19. The opportunity to ask questions was provided.   Coronavirus Screening  Have you experienced the following symptoms:  Cough yes/no: No Fever (>100.89F)  yes/no: No Runny nose yes/no: No Sore throat yes/no: No Difficulty breathing/shortness of breath  yes/no: No  Have you or a family member traveled in the last 14 days and where? yes/no: No   If the patient indicates "YES" to the above questions, their PAT will be rescheduled to limit the exposure to others and, the surgeon will be notified. THE PATIENT WILL NEED TO BE ASYMPTOMATIC FOR 14 DAYS.   If the patient is not experiencing any of these symptoms, the PAT nurse will instruct them to NOT bring anyone with them to their appointment since they may have these symptoms or traveled as well.   Please remind your patients and families that hospital visitation restrictions are in effect and the importance of the restrictions.

## 2020-03-09 ENCOUNTER — Ambulatory Visit: Payer: BC Managed Care – PPO

## 2020-03-09 ENCOUNTER — Other Ambulatory Visit (HOSPITAL_COMMUNITY)
Admission: RE | Admit: 2020-03-09 | Discharge: 2020-03-09 | Disposition: A | Discharge status: Home / Self Care | Payer: BC Managed Care – PPO | Source: Ambulatory Visit | Attending: Obstetrics and Gynecology | Admitting: Obstetrics and Gynecology

## 2020-03-09 DIAGNOSIS — Z01812 Encounter for preprocedural laboratory examination: Secondary | ICD-10-CM | POA: Insufficient documentation

## 2020-03-09 DIAGNOSIS — Z6841 Body Mass Index (BMI) 40.0 and over, adult: Secondary | ICD-10-CM | POA: Diagnosis not present

## 2020-03-09 DIAGNOSIS — Z91013 Allergy to seafood: Secondary | ICD-10-CM | POA: Diagnosis not present

## 2020-03-09 DIAGNOSIS — D252 Subserosal leiomyoma of uterus: Secondary | ICD-10-CM | POA: Diagnosis not present

## 2020-03-09 DIAGNOSIS — N800 Endometriosis of uterus: Secondary | ICD-10-CM | POA: Diagnosis not present

## 2020-03-09 DIAGNOSIS — Z23 Encounter for immunization: Secondary | ICD-10-CM

## 2020-03-09 DIAGNOSIS — N924 Excessive bleeding in the premenopausal period: Secondary | ICD-10-CM | POA: Diagnosis not present

## 2020-03-09 DIAGNOSIS — H409 Unspecified glaucoma: Secondary | ICD-10-CM | POA: Diagnosis not present

## 2020-03-09 DIAGNOSIS — G8918 Other acute postprocedural pain: Secondary | ICD-10-CM | POA: Diagnosis not present

## 2020-03-09 DIAGNOSIS — Z91010 Allergy to peanuts: Secondary | ICD-10-CM | POA: Diagnosis not present

## 2020-03-09 DIAGNOSIS — R7303 Prediabetes: Secondary | ICD-10-CM | POA: Diagnosis not present

## 2020-03-09 DIAGNOSIS — K810 Acute cholecystitis: Secondary | ICD-10-CM | POA: Diagnosis not present

## 2020-03-09 DIAGNOSIS — Z803 Family history of malignant neoplasm of breast: Secondary | ICD-10-CM | POA: Diagnosis not present

## 2020-03-09 DIAGNOSIS — Z20822 Contact with and (suspected) exposure to covid-19: Secondary | ICD-10-CM | POA: Diagnosis not present

## 2020-03-09 DIAGNOSIS — D259 Leiomyoma of uterus, unspecified: Secondary | ICD-10-CM | POA: Diagnosis not present

## 2020-03-09 LAB — SARS CORONAVIRUS 2 (TAT 6-24 HRS): SARS Coronavirus 2: NEGATIVE

## 2020-03-09 NOTE — Progress Notes (Signed)
   Covid-19 Vaccination Clinic  Name:  Kelly Ward    MRN: [id number] DOB: [birth year]  03/09/2020  Ms. Porta was observed post Covid-19 immunization for 15 minutes without incident. She was provided with Vaccine Information Sheet and instruction to access the V-Safe system.   [name] was instructed to call 911 with any severe reactions post vaccine: Marland Kitchen Difficulty breathing  . Swelling of face and throat  . A fast heartbeat  . A bad rash all over body  . Dizziness and weakness   Immunizations Administered    Name Date Dose VIS Date Route   Pfizer COVID-19 Vaccine 03/09/2020 11:32 AM 0.3 mL 01/14/2019 Intramuscular   Manufacturer: [place]   Lot: [id number]   [place]: [id number]

## 2020-03-11 MED ORDER — DEXTROSE 5 % IV SOLN
3.0000 g | INTRAVENOUS | Status: AC
  Administered 2020-03-12: 3 g via INTRAVENOUS
  Filled 2020-03-11: qty 3000
  Filled 2020-03-11: qty 3

## 2020-03-12 ENCOUNTER — Other Ambulatory Visit: Payer: Self-pay

## 2020-03-12 ENCOUNTER — Inpatient Hospital Stay (HOSPITAL_COMMUNITY)
Admission: RE | Admit: 2020-03-12 | Discharge: 2020-03-14 | DRG: 742 | Disposition: A | Discharge status: Home / Self Care | Payer: BC Managed Care – PPO | Attending: Obstetrics and Gynecology | Admitting: Obstetrics and Gynecology

## 2020-03-12 ENCOUNTER — Inpatient Hospital Stay (HOSPITAL_COMMUNITY): Payer: BC Managed Care – PPO | Admitting: Certified Registered Nurse Anesthetist

## 2020-03-12 ENCOUNTER — Encounter (HOSPITAL_COMMUNITY)
Admission: RE | Disposition: A | Discharge status: Home / Self Care | Payer: Self-pay | Source: Home / Self Care | Attending: Obstetrics and Gynecology

## 2020-03-12 ENCOUNTER — Encounter (HOSPITAL_COMMUNITY): Payer: Self-pay | Admitting: Obstetrics and Gynecology

## 2020-03-12 ENCOUNTER — Encounter: Admission: RE | Payer: Self-pay | Source: Home / Self Care

## 2020-03-12 DIAGNOSIS — Z20822 Contact with and (suspected) exposure to covid-19: Secondary | ICD-10-CM | POA: Diagnosis present

## 2020-03-12 DIAGNOSIS — Z6841 Body Mass Index (BMI) 40.0 and over, adult: Secondary | ICD-10-CM | POA: Diagnosis not present

## 2020-03-12 DIAGNOSIS — Z90710 Acquired absence of both cervix and uterus: Secondary | ICD-10-CM | POA: Diagnosis present

## 2020-03-12 DIAGNOSIS — Z91010 Allergy to peanuts: Secondary | ICD-10-CM | POA: Diagnosis not present

## 2020-03-12 DIAGNOSIS — Z91013 Allergy to seafood: Secondary | ICD-10-CM | POA: Diagnosis not present

## 2020-03-12 DIAGNOSIS — D259 Leiomyoma of uterus, unspecified: Principal | ICD-10-CM | POA: Diagnosis present

## 2020-03-12 DIAGNOSIS — R7303 Prediabetes: Secondary | ICD-10-CM | POA: Diagnosis present

## 2020-03-12 DIAGNOSIS — H409 Unspecified glaucoma: Secondary | ICD-10-CM | POA: Diagnosis present

## 2020-03-12 DIAGNOSIS — N924 Excessive bleeding in the premenopausal period: Secondary | ICD-10-CM | POA: Diagnosis present

## 2020-03-12 DIAGNOSIS — Z803 Family history of malignant neoplasm of breast: Secondary | ICD-10-CM

## 2020-03-12 HISTORY — PX: HYSTERECTOMY ABDOMINAL WITH SALPINGECTOMY: SHX6725

## 2020-03-12 LAB — GLUCOSE, CAPILLARY
Glucose-Capillary: 80 mg/dL (ref 70–99)
Glucose-Capillary: 122 mg/dL — ABNORMAL HIGH (ref 70–99)

## 2020-03-12 LAB — POCT PREGNANCY, URINE: Preg Test, Ur: NEGATIVE

## 2020-03-12 SURGERY — HYSTERECTOMY, TOTAL, ABDOMINAL, WITH SALPINGECTOMY
Anesthesia: General

## 2020-03-12 MED ORDER — IBUPROFEN 800 MG PO TABS
800.0000 mg | ORAL_TABLET | Freq: Three times a day (TID) | ORAL | Status: DC
  Administered 2020-03-12 – 2020-03-14 (×6): 800 mg via ORAL
  Filled 2020-03-12 (×6): qty 1

## 2020-03-12 MED ORDER — SCOPOLAMINE 1 MG/3DAYS TD PT72
MEDICATED_PATCH | TRANSDERMAL | Status: AC
  Filled 2020-03-12: qty 1

## 2020-03-12 MED ORDER — MENTHOL 3 MG MT LOZG: 1.0000 | LOZENGE | OROMUCOSAL | Status: DC | PRN

## 2020-03-12 MED ORDER — SUGAMMADEX SODIUM 200 MG/2ML IV SOLN
INTRAVENOUS | Status: DC | PRN
  Administered 2020-03-12: 400 mg via INTRAVENOUS

## 2020-03-12 MED ORDER — PROMETHAZINE HCL 25 MG/ML IJ SOLN: 6.2500 mg | INTRAMUSCULAR | Status: DC | PRN

## 2020-03-12 MED ORDER — SUCCINYLCHOLINE CHLORIDE 200 MG/10ML IV SOSY
PREFILLED_SYRINGE | INTRAVENOUS | Status: AC
  Filled 2020-03-12: qty 10

## 2020-03-12 MED ORDER — KETAMINE HCL 50 MG/5ML IJ SOSY
PREFILLED_SYRINGE | INTRAMUSCULAR | Status: AC
  Filled 2020-03-12: qty 5

## 2020-03-12 MED ORDER — ACETAMINOPHEN 10 MG/ML IV SOLN
INTRAVENOUS | Status: DC | PRN
  Administered 2020-03-12: 1000 mg via INTRAVENOUS

## 2020-03-12 MED ORDER — BUPIVACAINE HCL (PF) 0.25 % IJ SOLN
INTRAMUSCULAR | Status: DC | PRN
  Administered 2020-03-12: 7 mL

## 2020-03-12 MED ORDER — ROCURONIUM BROMIDE 100 MG/10ML IV SOLN
INTRAVENOUS | Status: DC | PRN
  Administered 2020-03-12 (×2): 20 mg via INTRAVENOUS
  Administered 2020-03-12: 70 mg via INTRAVENOUS
  Administered 2020-03-12 (×2): 20 mg via INTRAVENOUS
  Administered 2020-03-12: 10 mg via INTRAVENOUS
  Administered 2020-03-12: 20 mg via INTRAVENOUS

## 2020-03-12 MED ORDER — MUPIROCIN 2 % EX OINT
1.0000 "application " | TOPICAL_OINTMENT | Freq: Once | CUTANEOUS | Status: AC
  Administered 2020-03-12: 1 via TOPICAL
  Filled 2020-03-12: qty 22

## 2020-03-12 MED ORDER — HYDROMORPHONE 1 MG/ML IV SOLN
INTRAVENOUS | Status: DC
  Administered 2020-03-12: 30 mg via INTRAVENOUS
  Administered 2020-03-12: 1 mg via INTRAVENOUS
  Administered 2020-03-13 (×2): 0.4 mg via INTRAVENOUS

## 2020-03-12 MED ORDER — PROPOFOL 10 MG/ML IV BOLUS
INTRAVENOUS | Status: DC | PRN
  Administered 2020-03-12: 200 mg via INTRAVENOUS

## 2020-03-12 MED ORDER — SODIUM CHLORIDE 0.9% FLUSH: 9.0000 mL | INTRAVENOUS | Status: DC | PRN

## 2020-03-12 MED ORDER — PROPOFOL 10 MG/ML IV BOLUS
INTRAVENOUS | Status: AC
  Filled 2020-03-12: qty 40

## 2020-03-12 MED ORDER — ACETAMINOPHEN 10 MG/ML IV SOLN: 1000.0000 mg | Freq: Once | INTRAVENOUS | Status: DC | PRN

## 2020-03-12 MED ORDER — MEPERIDINE HCL 25 MG/ML IJ SOLN: 6.2500 mg | INTRAMUSCULAR | Status: DC | PRN

## 2020-03-12 MED ORDER — KETAMINE HCL 10 MG/ML IJ SOLN
INTRAMUSCULAR | Status: DC | PRN
  Administered 2020-03-12 (×2): 10 mg via INTRAVENOUS
  Administered 2020-03-12: 30 mg via INTRAVENOUS

## 2020-03-12 MED ORDER — ACETAMINOPHEN 325 MG PO TABS: 325.0000 mg | ORAL_TABLET | Freq: Once | ORAL | Status: DC | PRN

## 2020-03-12 MED ORDER — LACTATED RINGERS IV SOLN: INTRAVENOUS | Status: DC

## 2020-03-12 MED ORDER — ONDANSETRON HCL 4 MG/2ML IJ SOLN
INTRAMUSCULAR | Status: AC
  Filled 2020-03-12: qty 2

## 2020-03-12 MED ORDER — MIDAZOLAM HCL 2 MG/2ML IJ SOLN
INTRAMUSCULAR | Status: AC
  Filled 2020-03-12: qty 2

## 2020-03-12 MED ORDER — ONDANSETRON HCL 4 MG/2ML IJ SOLN: 4.0000 mg | Freq: Four times a day (QID) | INTRAMUSCULAR | Status: DC | PRN

## 2020-03-12 MED ORDER — OXYCODONE HCL 5 MG PO TABS
5.0000 mg | ORAL_TABLET | ORAL | Status: DC | PRN
  Administered 2020-03-13 – 2020-03-14 (×2): 10 mg via ORAL
  Filled 2020-03-12 (×2): qty 2

## 2020-03-12 MED ORDER — DEXTROSE IN LACTATED RINGERS 5 % IV SOLN: INTRAVENOUS | Status: DC

## 2020-03-12 MED ORDER — DEXAMETHASONE SODIUM PHOSPHATE 10 MG/ML IJ SOLN
INTRAMUSCULAR | Status: AC
  Filled 2020-03-12: qty 1

## 2020-03-12 MED ORDER — ROCURONIUM BROMIDE 10 MG/ML (PF) SYRINGE
PREFILLED_SYRINGE | INTRAVENOUS | Status: AC
  Filled 2020-03-12: qty 30

## 2020-03-12 MED ORDER — PHENYLEPHRINE 40 MCG/ML (10ML) SYRINGE FOR IV PUSH (FOR BLOOD PRESSURE SUPPORT)
PREFILLED_SYRINGE | INTRAVENOUS | Status: DC | PRN
  Administered 2020-03-12 (×5): 80 ug via INTRAVENOUS

## 2020-03-12 MED ORDER — BUPIVACAINE-EPINEPHRINE (PF) 0.5% -1:200000 IJ SOLN
INTRAMUSCULAR | Status: DC | PRN
Start: 2020-03-12 — End: 2020-03-12
  Administered 2020-03-12 (×2): 25 mL

## 2020-03-12 MED ORDER — DIPHENHYDRAMINE HCL 50 MG/ML IJ SOLN: 12.5000 mg | Freq: Four times a day (QID) | INTRAMUSCULAR | Status: DC | PRN

## 2020-03-12 MED ORDER — ACETAMINOPHEN 10 MG/ML IV SOLN
INTRAVENOUS | Status: AC
  Filled 2020-03-12: qty 100

## 2020-03-12 MED ORDER — ALBUMIN HUMAN 5 % IV SOLN: INTRAVENOUS | Status: DC | PRN

## 2020-03-12 MED ORDER — PHENYLEPHRINE 40 MCG/ML (10ML) SYRINGE FOR IV PUSH (FOR BLOOD PRESSURE SUPPORT)
PREFILLED_SYRINGE | INTRAVENOUS | Status: AC
  Filled 2020-03-12: qty 10

## 2020-03-12 MED ORDER — SCOPOLAMINE 1 MG/3DAYS TD PT72
MEDICATED_PATCH | TRANSDERMAL | Status: DC | PRN
  Administered 2020-03-12: 1 via TRANSDERMAL

## 2020-03-12 MED ORDER — HYDROMORPHONE 1 MG/ML IV SOLN
INTRAVENOUS | Status: AC
  Filled 2020-03-12: qty 30

## 2020-03-12 MED ORDER — PANTOPRAZOLE SODIUM 40 MG PO TBEC
40.0000 mg | DELAYED_RELEASE_TABLET | Freq: Every day | ORAL | Status: DC
  Administered 2020-03-12 – 2020-03-14 (×3): 40 mg via ORAL
  Filled 2020-03-12 (×3): qty 1

## 2020-03-12 MED ORDER — FENTANYL CITRATE (PF) 250 MCG/5ML IJ SOLN
INTRAMUSCULAR | Status: AC
  Filled 2020-03-12: qty 5

## 2020-03-12 MED ORDER — MIDAZOLAM HCL 5 MG/5ML IJ SOLN
INTRAMUSCULAR | Status: DC | PRN
  Administered 2020-03-12: 2 mg via INTRAVENOUS

## 2020-03-12 MED ORDER — LIDOCAINE 2% (20 MG/ML) 5 ML SYRINGE
INTRAMUSCULAR | Status: AC
  Filled 2020-03-12: qty 5

## 2020-03-12 MED ORDER — BUPIVACAINE HCL (PF) 0.25 % IJ SOLN
INTRAMUSCULAR | Status: AC
  Filled 2020-03-12: qty 30

## 2020-03-12 MED ORDER — FENTANYL CITRATE (PF) 100 MCG/2ML IJ SOLN
25.0000 ug | INTRAMUSCULAR | Status: DC | PRN
  Administered 2020-03-12: 50 ug via INTRAVENOUS

## 2020-03-12 MED ORDER — DIPHENHYDRAMINE HCL 12.5 MG/5ML PO ELIX: 12.5000 mg | ORAL_SOLUTION | Freq: Four times a day (QID) | ORAL | Status: DC | PRN

## 2020-03-12 MED ORDER — ACETAMINOPHEN 160 MG/5ML PO SOLN: 325.0000 mg | Freq: Once | ORAL | Status: DC | PRN

## 2020-03-12 MED ORDER — VASOPRESSIN 20 UNIT/ML IV SOLN
INTRAVENOUS | Status: AC
  Filled 2020-03-12: qty 3

## 2020-03-12 MED ORDER — SUGAMMADEX SODIUM 500 MG/5ML IV SOLN
INTRAVENOUS | Status: AC
  Filled 2020-03-12: qty 5

## 2020-03-12 MED ORDER — FENTANYL CITRATE (PF) 100 MCG/2ML IJ SOLN
INTRAMUSCULAR | Status: AC
  Filled 2020-03-12: qty 2

## 2020-03-12 MED ORDER — DEXAMETHASONE SODIUM PHOSPHATE 10 MG/ML IJ SOLN
INTRAMUSCULAR | Status: DC | PRN
  Administered 2020-03-12: 5 mg via INTRAVENOUS

## 2020-03-12 MED ORDER — SUCCINYLCHOLINE CHLORIDE 20 MG/ML IJ SOLN
INTRAMUSCULAR | Status: DC | PRN
  Administered 2020-03-12: 160 mg via INTRAVENOUS

## 2020-03-12 MED ORDER — PHENYLEPHRINE HCL-NACL 10-0.9 MG/250ML-% IV SOLN
INTRAVENOUS | Status: DC | PRN
  Administered 2020-03-12: 50 ug/min via INTRAVENOUS

## 2020-03-12 MED ORDER — SIMETHICONE 80 MG PO CHEW
80.0000 mg | CHEWABLE_TABLET | Freq: Four times a day (QID) | ORAL | Status: DC | PRN
  Administered 2020-03-13: 22:00:00 80 mg via ORAL
  Filled 2020-03-12: qty 1

## 2020-03-12 MED ORDER — ROCURONIUM BROMIDE 10 MG/ML (PF) SYRINGE
PREFILLED_SYRINGE | INTRAVENOUS | Status: AC
  Filled 2020-03-12: qty 10

## 2020-03-12 MED ORDER — ONDANSETRON HCL 4 MG PO TABS: 4.0000 mg | ORAL_TABLET | Freq: Four times a day (QID) | ORAL | Status: DC | PRN

## 2020-03-12 MED ORDER — FENTANYL CITRATE (PF) 250 MCG/5ML IJ SOLN
INTRAMUSCULAR | Status: DC | PRN
  Administered 2020-03-12 (×3): 50 ug via INTRAVENOUS
  Administered 2020-03-12: 100 ug via INTRAVENOUS

## 2020-03-12 MED ORDER — ONDANSETRON HCL 4 MG/2ML IJ SOLN
INTRAMUSCULAR | Status: DC | PRN
  Administered 2020-03-12: 4 mg via INTRAVENOUS

## 2020-03-12 MED ORDER — NALOXONE HCL 0.4 MG/ML IJ SOLN: 0.4000 mg | INTRAMUSCULAR | Status: DC | PRN

## 2020-03-12 MED ORDER — LACTATED RINGERS IV SOLN: INTRAVENOUS | Status: DC | PRN

## 2020-03-12 MED ORDER — LIDOCAINE 2% (20 MG/ML) 5 ML SYRINGE
INTRAMUSCULAR | Status: DC | PRN
  Administered 2020-03-12: 100 mg via INTRAVENOUS

## 2020-03-12 SURGICAL SUPPLY — 54 items
APL SKNCLS STERI-STRIP NONHPOA (GAUZE/BANDAGES/DRESSINGS) ×1
BAG URINE DRAINAGE (UROLOGICAL SUPPLIES) ×3 IMPLANT
BENZOIN TINCTURE PRP APPL 2/3 (GAUZE/BANDAGES/DRESSINGS) ×3 IMPLANT
CANISTER SUCT 3000ML PPV (MISCELLANEOUS) ×3 IMPLANT
CATH FOLEY 3WAY  5CC 16FR (CATHETERS) ×3
CATH FOLEY 3WAY 5CC 16FR (CATHETERS) ×1 IMPLANT
CLOSURE WOUND 1/2 X4 (GAUZE/BANDAGES/DRESSINGS) ×1
COVER WAND RF STERILE (DRAPES) ×3 IMPLANT
DECANTER SPIKE VIAL GLASS SM (MISCELLANEOUS) ×3 IMPLANT
DRAPE CESAREAN BIRTH W POUCH (DRAPES) ×3 IMPLANT
DRAPE WARM FLUID 44X44 (DRAPES) IMPLANT
DRSG OPSITE POSTOP 4X10 (GAUZE/BANDAGES/DRESSINGS) ×3 IMPLANT
DURAPREP 26ML APPLICATOR (WOUND CARE) ×3 IMPLANT
GAUZE 4X4 16PLY RFD (DISPOSABLE) IMPLANT
GLOVE BIOGEL PI IND STRL 7.0 (GLOVE) ×3 IMPLANT
GLOVE BIOGEL PI INDICATOR 7.0 (GLOVE) ×6
GLOVE ECLIPSE 6.5 STRL STRAW (GLOVE) ×3 IMPLANT
GOWN STRL REUS W/ TWL LRG LVL3 (GOWN DISPOSABLE) ×3 IMPLANT
GOWN STRL REUS W/TWL LRG LVL3 (GOWN DISPOSABLE) ×9
HEMOSTAT ARISTA ABSORB 3G PWDR (HEMOSTASIS) ×2 IMPLANT
HIBICLENS CHG 4% 4OZ BTL (MISCELLANEOUS) ×3 IMPLANT
KIT DRSG PREVENA PLUS 7DAY 125 (MISCELLANEOUS) ×2 IMPLANT
KIT PREVENA INCISION MGT20CM45 (CANNISTER) ×2 IMPLANT
KIT TURNOVER KIT B (KITS) ×3 IMPLANT
LIGASURE IMPACT 36 18CM CVD LR (INSTRUMENTS) ×2 IMPLANT
NEEDLE HYPO 22GX1.5 SAFETY (NEEDLE) ×3 IMPLANT
NS IRRIG 1000ML POUR BTL (IV SOLUTION) ×3 IMPLANT
PACK ABDOMINAL GYN (CUSTOM PROCEDURE TRAY) ×3 IMPLANT
PAD ARMBOARD 7.5X6 YLW CONV (MISCELLANEOUS) ×3 IMPLANT
PAD OB MATERNITY 4.3X12.25 (PERSONAL CARE ITEMS) ×3 IMPLANT
PLUG CATH AND CAP STER (CATHETERS) ×3 IMPLANT
PREVENA RESTOR ARTHOFORM 33X30 (CANNISTER) ×2 IMPLANT
RETRACTOR TRAXI PANNICULUS (MISCELLANEOUS) IMPLANT
RETRACTOR WND ALEXIS 25 LRG (MISCELLANEOUS) IMPLANT
RTRCTR WOUND ALEXIS 25CM LRG (MISCELLANEOUS) ×3
SPECIMEN JAR MEDIUM (MISCELLANEOUS) ×3 IMPLANT
SPONGE LAP 18X18 RF (DISPOSABLE) ×6 IMPLANT
STAPLER VISISTAT 35W (STAPLE) ×3 IMPLANT
STRIP CLOSURE SKIN 1/2X4 (GAUZE/BANDAGES/DRESSINGS) ×2 IMPLANT
SUT PLAIN 2 0 XLH (SUTURE) IMPLANT
SUT PROLENE 0 CT 1 30 (SUTURE) IMPLANT
SUT VIC AB 0 CT1 18XCR BRD 8 (SUTURE) IMPLANT
SUT VIC AB 0 CT1 18XCR BRD8 (SUTURE) ×3 IMPLANT
SUT VIC AB 0 CT1 36 (SUTURE) ×16 IMPLANT
SUT VIC AB 0 CT1 8-18 (SUTURE) ×18
SUT VIC AB 2-0 CT1 27 (SUTURE) ×3
SUT VIC AB 2-0 CT1 TAPERPNT 27 (SUTURE) ×1 IMPLANT
SUT VIC AB 3-0 SH 27 (SUTURE) ×3
SUT VIC AB 3-0 SH 27X BRD (SUTURE) IMPLANT
SUT VIC AB 4-0 KS 27 (SUTURE) ×2 IMPLANT
SUT VICRYL 0 TIES 12 18 (SUTURE) ×3 IMPLANT
SYR CONTROL 10ML LL (SYRINGE) ×3 IMPLANT
TOWEL GREEN STERILE FF (TOWEL DISPOSABLE) ×6 IMPLANT
TRAXI PANNICULUS RETRACTOR (MISCELLANEOUS) ×2

## 2020-03-12 NOTE — Progress Notes (Signed)
Admitted to 6N22 from PACU post total hysterectomy, incision intact with vac dressing. VSS. Not in any distress. Alert but still drowsy from surgery. Will endorse appropriately.

## 2020-03-12 NOTE — Transfer of Care (Signed)
Immediate Anesthesia Transfer of Care Note  Patient: Kelly Ward  Procedure(s) Performed: HYSTERECTOMY ABDOMINAL WITH BILATERAL SALPINGECTOMY (N/A )  Patient Location: PACU  Anesthesia Type:GA combined with regional for post-op pain  Level of Consciousness: awake and patient cooperative  Airway & Oxygen Therapy: Patient Spontanous Breathing and Patient connected to face mask oxygen  Post-op Assessment: Report given to RN and Post -op Vital signs reviewed and stable  Post vital signs: Reviewed and stable  Last Vitals:  Vitals Value Taken Time  BP 152/81 03/12/20 1736  Temp 36.5 C 03/12/20 1736  Pulse 72 03/12/20 1741  Resp 14 03/12/20 1741  SpO2 99 % 03/12/20 1741  Vitals shown include unvalidated device data.  Last Pain:  Vitals:   03/12/20 1736  TempSrc:   PainSc: (P) Asleep         Complications: No apparent anesthesia complications

## 2020-03-12 NOTE — H&P (Signed)
Kelly Ward is an 54 y.o. female BF with abnormal perimenopausal bleeding and uterine fibroid presents for exp lap, TAH, bilateral salpingectomy. Pt had prolonged heavy vaginal bleeding for which she was seen at [facility] and underwent CT scan/pelvic sonogram. EBX 09/2019 showed proliferative endometrium no hyperplasia. Pt request definitive mgmt  Pertinent Gynecological History: Menses: irreg Bleeding: dysfunctional uterine bleeding Contraception: none DES exposure: denies Blood transfusions: none Sexually transmitted diseases: no past history Previous GYN Procedures: n/a  Last mammogram: normal Date: 2020 Last pap: normal Date: 2020 OB History: G1P0010   Menstrual History: Menarche age: n/a No LMP recorded. (Menstrual status: Perimenopausal).    Past Medical History:  Diagnosis Date  . Anemia   . Glaucoma    both eyes  . Pre-diabetes   . Severe obesity (BMI >= 40) ([place]) 05/29/2016   bmi 54    Past Surgical History:  Procedure Laterality Date  . CHOLECYSTECTOMY N/A 05/28/2016   Procedure: LAPAROSCOPIC CHOLECYSTECTOMY ;  Surgeon: Rolm Bookbinder, MD;  Location: [facility];  Service: General;  Laterality: N/A;  . COLONOSCOPY WITH PROPOFOL N/A 12/16/2015   Procedure: COLONOSCOPY WITH PROPOFOL;  Surgeon: Juanita Craver, MD;  Location: [facility];  Service: Endoscopy;  Laterality: N/A;  . NO PAST SURGERIES      Family History  Problem Relation Age of Onset  . Breast cancer Sister     Social History:  reports that she has never smoked. She has never used smokeless tobacco. She reports that she does not drink alcohol or use drugs.  Allergies:  Allergies  Allergen Reactions  . Peanut-Containing Drug Products Hives, Itching and Swelling    Eyes, mouth. throat  . Shellfish Allergy Hives    No medications prior to admission.    Review of Systems  All other systems reviewed and are negative.   There were no vitals taken for this visit. Physical Exam  Constitutional:  She is oriented to person, place, and time. She appears well-developed and well-nourished.  Morbid obesity  HENT:  Head: Atraumatic.  Eyes: EOM are normal.  Cardiovascular: Regular rhythm.  Respiratory: Breath sounds normal.  GI: Soft.  Obese. Large pannus  Genitourinary:    Genitourinary Comments: Vulva nl Vagina nl Cervix closed Uterus enlarged limited by body habitus Adnexa non palp   Neurological: She is alert and oriented to person, place, and time.  Skin: Skin is warm and dry.  Psychiatric: She has a normal mood and affect.    No results found for this or any previous visit (from the past 24 hour(s)).   Assessment/Plan: Abnormal perimenopausal uterus Fibroid uterus Morbid obesity P) exp lap, TAH, bilateral salpingectomy. Risk of surgery reviewed including infection, bleeding, possible need for blood transfusion and its risk( HIV, acute rxn, hepatitis), injury to surrounding organ structures, internal scar tissue, poss need for removal of ovaries in the future due to ovarian disease including ovarian cancer. All question answered  Jermani Eberlein A Tmya Wigington 03/12/2020, 8:14 AM

## 2020-03-12 NOTE — Interval H&P Note (Signed)
History and Physical Interval Note:  03/12/2020 1:33 PM  Kelly Ward  has presented today for surgery, with the diagnosis of Symptomatic Uterine Fibroid.  The various methods of treatment have been discussed with the patient and family. After consideration of risks, benefits and other options for treatment, the patient has consented to  Procedure(s): HYSTERECTOMY ABDOMINAL WITH SALPINGECTOMY WITH EXPLORATORY LAPAROTOMY (N/A) as a surgical intervention.  The patient's history has been reviewed, patient examined, no change in status, stable for surgery.  I have reviewed the patient's chart and labs.  Questions were answered to the patient's satisfaction.     [name] A Tanaisha Pittman

## 2020-03-12 NOTE — Anesthesia Procedure Notes (Addendum)
Anesthesia Regional Block: TAP block   Pre-Anesthetic Checklist: ,, timeout performed, Correct Patient, Correct Site, Correct Laterality, Correct Procedure, Correct Position, site marked, Risks and benefits discussed,  Surgical consent,  Pre-op evaluation,  At surgeon's request and post-op pain management  Laterality: Left  Prep: chloraprep       Needles:  Injection technique: Single-shot  Needle Type: Echogenic Stimulator Needle     Needle Length: 9cm  Needle Gauge: 21     Additional Needles:   Procedures:,,,, ultrasound used (permanent image in chart),,,,  Narrative:  Start time: 03/12/2020 2:00 PM End time: 03/12/2020 2:05 PM Injection made incrementally with aspirations every 5 mL.  Performed by: Personally  Anesthesiologist: Effie Berkshire, MD  Additional Notes: Patient tolerated the procedure well. Local anesthetic introduced in an incremental fashion under minimal resistance after negative aspirations. No paresthesias were elicited. After completion of the procedure, no acute issues were identified and patient continued to be monitored by RN.

## 2020-03-12 NOTE — Anesthesia Preprocedure Evaluation (Signed)
Anesthesia Evaluation  Patient identified by MRN, date of birth, ID band Patient awake    Reviewed: Allergy & Precautions, NPO status , Patient's Chart, lab work & pertinent test results  Airway Mallampati: I  TM Distance: >3 FB Neck ROM: Full    Dental  (+) Teeth Intact, Dental Advisory Given   Pulmonary neg pulmonary ROS,    breath sounds clear to auscultation       Cardiovascular negative cardio ROS   Rhythm:Regular Rate:Normal     Neuro/Psych negative neurological ROS     GI/Hepatic negative GI ROS, Neg liver ROS,   Endo/Other  negative endocrine ROS  Renal/GU negative Renal ROS     Musculoskeletal negative musculoskeletal ROS (+)   Abdominal (+) + obese,   Peds  Hematology negative hematology ROS (+)   Anesthesia Other Findings   Reproductive/Obstetrics                             Anesthesia Physical Anesthesia Plan  ASA: III  Anesthesia Plan: General   Post-op Pain Management:  Regional for Post-op pain   Induction: Intravenous  PONV Risk Score and Plan: 4 or greater and Ondansetron, Dexamethasone, Midazolam and Scopolamine patch - Pre-op  Airway Management Planned: Oral ETT  Additional Equipment: None  Intra-op Plan:   Post-operative Plan: Extubation in OR  Informed Consent: I have reviewed the patients History and Physical, chart, labs and discussed the procedure including the risks, benefits and alternatives for the proposed anesthesia with the patient or authorized representative who has indicated his/her understanding and acceptance.     Dental advisory given  Plan Discussed with: CRNA  Anesthesia Plan Comments:         Anesthesia Quick Evaluation

## 2020-03-12 NOTE — Anesthesia Procedure Notes (Signed)
Procedure Name: Intubation Date/Time: 03/12/2020 1:58 PM Performed by: Janene Harvey, CRNA Pre-anesthesia Checklist: Patient identified, Emergency Drugs available, Suction available and Patient being monitored Patient Re-evaluated:Patient Re-evaluated prior to induction Oxygen Delivery Method: Circle system utilized Preoxygenation: Pre-oxygenation with 100% oxygen Induction Type: IV induction and Rapid sequence Laryngoscope Size: Mac and 4 Grade View: Grade II Tube type: Oral Tube size: 7.0 mm Number of attempts: 1 Airway Equipment and Method: Stylet and Oral airway Placement Confirmation: ETT inserted through vocal cords under direct vision,  positive ETCO2 and breath sounds checked- equal and bilateral Secured at: 22 cm Tube secured with: Tape Dental Injury: Teeth and Oropharynx as per pre-operative assessment

## 2020-03-12 NOTE — Brief Op Note (Signed)
03/12/2020  5:48 PM  PATIENT:  Kelly Ward  54 y.o. female  PRE-OPERATIVE DIAGNOSIS:  Symptomatic Uterine Fibroid, abnormal perimenopausal bleeding, morbid obesity  POST-OPERATIVE DIAGNOSIS:  Symptomatic Uterine Fibroid, abnormal perimenopausal bleeding. Morbid obesity  PROCEDURE:  Exp lap, TAH, bilateral salpingectomy  SURGEON:  Surgeon(s) and Role:    * Servando Salina, MD - Primary    * Brien Few, MD - Assisting  PHYSICIAN ASSISTANT:   ASSISTANTS: Brien Few, MD   ANESTHESIA:   general  EBL:  700 mL   BLOOD ADMINISTERED:none  DRAINS: none   LOCAL MEDICATIONS USED:  MARCAINE     SPECIMEN:  Source of Specimen:  uterus, cervix, tube  DISPOSITION OF SPECIMEN:  PATHOLOGY  COUNTS:  YES  TOURNIQUET:  * No tourniquets in log *  DICTATION: .Other Dictation: Dictation Number [id number]  PLAN OF CARE: Admit to inpatient   PATIENT DISPOSITION:  PACU - hemodynamically stable.   Delay start of Pharmacological VTE agent (>24hrs) due to surgical blood loss or risk of bleeding: no

## 2020-03-12 NOTE — Anesthesia Procedure Notes (Addendum)
Anesthesia Regional Block: TAP block   Pre-Anesthetic Checklist: ,, timeout performed, Correct Patient, Correct Site, Correct Laterality, Correct Procedure, Correct Position, site marked, Risks and benefits discussed,  Surgical consent,  Pre-op evaluation,  At surgeon's request and post-op pain management  Laterality: Right  Prep: chloraprep       Needles:  Injection technique: Single-shot  Needle Type: Echogenic Stimulator Needle     Needle Length: 9cm  Needle Gauge: 21     Additional Needles:   Procedures:,,,, ultrasound used (permanent image in chart),,,,  Narrative:  Start time: 03/12/2020 2:05 PM End time: 03/12/2020 2:10 PM Injection made incrementally with aspirations every 5 mL.  Performed by: Personally  Anesthesiologist: Effie Berkshire, MD  Additional Notes: Patient tolerated the procedure well. Local anesthetic introduced in an incremental fashion under minimal resistance after negative aspirations. No paresthesias were elicited. After completion of the procedure, no acute issues were identified and patient continued to be monitored by RN.

## 2020-03-13 LAB — CBC
HCT: 32.9 % — ABNORMAL LOW (ref 36.0–46.0)
Hemoglobin: 10.4 g/dL — ABNORMAL LOW (ref 12.0–15.0)
MCH: 26.9 pg (ref 26.0–34.0)
MCHC: 31.6 g/dL (ref 30.0–36.0)
MCV: 85 fL (ref 80.0–100.0)
Platelets: 228 10*3/uL (ref 150–400)
RBC: 3.87 MIL/uL (ref 3.87–5.11)
RDW: 13.1 % (ref 11.5–15.5)
WBC: 8.9 10*3/uL (ref 4.0–10.5)
nRBC: 0 % (ref 0.0–0.2)

## 2020-03-13 LAB — BASIC METABOLIC PANEL
Anion gap: 7 (ref 5–15)
BUN: 10 mg/dL (ref 6–20)
CO2: 25 mmol/L (ref 22–32)
Calcium: 8.5 mg/dL — ABNORMAL LOW (ref 8.9–10.3)
Chloride: 105 mmol/L (ref 98–111)
Creatinine, Ser: 0.81 mg/dL (ref 0.44–1.00)
GFR calc Af Amer: >60 mL/min (ref >60)
GFR calc non Af Amer: >60 mL/min (ref >60)
Glucose, Bld: 98 mg/dL (ref 70–99)
Potassium: 3.8 mmol/L (ref 3.5–5.1)
Sodium: 137 mmol/L (ref 135–145)

## 2020-03-13 MED ORDER — CHLORHEXIDINE GLUCONATE CLOTH 2 % EX PADS: 6.0000 | MEDICATED_PAD | Freq: Every day | CUTANEOUS | Status: DC

## 2020-03-13 NOTE — Progress Notes (Signed)
After foley was discontinued ambulated to the nurses station and back to room. Arthor Captain LPN

## 2020-03-13 NOTE — Op Note (Signed)
Kelly Ward, [facility] MEDICAL RECORD [id number] ACCOUNT [id number] DATE OF BIRTH:1966-09-14 FACILITY: [facility] LOCATION: [facility] PHYSICIAN:Dashauna Heymann A. Sehaj Mcenroe, MD  OPERATIVE REPORT  DATE OF PROCEDURE:  03/12/2020  PREOPERATIVE DIAGNOSES:   1.  Symptomatic uterine fibroids.   2.  Abnormal perimenopausal bleeding.  3.  Morbid obesity.  PROCEDURES:   1.  Exploratory laparotomy.  2.  Total abdominal hysterectomy.  3.  Bilateral salpingectomy.  POSTOPERATIVE DIAGNOSES:   1.  Symptomatic uterine fibroids.   2.  Abnormal perimenopausal bleeding.  3.  Morbid obesity.  ANESTHESIA:  General.  SURGEON:  Servando Salina, MD  ASSISTANT:  Brien Few, MD   DESCRIPTION OF PROCEDURE:  Under adequate general anesthesia, the patient was placed in the supine position.  Anesthesia performed a TAP for pain management.  The patient had a Triaxi placed due to her morbid obesity and large pannus.  The patient was  then sterilely prepped and draped in the usual fashion.  Indwelling Foley catheter was sterilely placed.  Marcaine 0.25% was injected along the planned Pfannenstiel skin incision site.  Pfannenstiel skin incision was then made, carried down to the rectus  fascia.  The rectus fascia was opened transversely.  The rectus fascia was then bluntly and sharply dissected off the rectus muscle in a superior and inferior fashion.  The rectus muscle was split in the midline.  The parietal peritoneum was entered  sharply and extended.  A large 16-week size uterus was encountered, was irregular due to the multiple uterine fibroids.  The uterus was subsequently exteriorized.  The round ligament was bilaterally grasped, suture ligated with 0 Vicryl x2 and the  intervening segment was opened with cautery and extended anteriorly, opening the anterior vesicouterine peritoneum and displacing the bladder with sharp dissection.  The fallopian tubes were grasped bilaterally and the underlying  mesosalpinx was serially  clamped, cauterized and cut using the LigaSure and both tubes were removed.  The posterior leaf of the broad ligament was opened and the right uteroovarian ligament was doubly clamped, cut, suture ligated with 0 Vicryl x2.  The uterine vessels were then  skeletonized on the right.  The uterine vessels were then doubly clamped, cut, and suture ligated with 0 Vicryl x2.  The same procedure was performed on the opposite side.  At that point to facilitate the surgery, the uterus was truncated above the  pedicles of the uterine vessels.  Once the uterus was removed, the remaining cervix was grasped with a single-tooth tenaculum and the procedure was continued.  The Alexis retractor was then placed.  The bowel was displaced upwardly and the bladder was  continued to be displaced inferiorly.  Bleeding was encountered in different locations which required attention individually.  The cardinal ligaments were bilaterally clamped, cut, and suture ligated with 0 Vicryl.  This was continued until the  cervicovaginal junction was felt to have been reached, at which time the left vaginal angle was clamped, cut, suture ligated with 0 Vicryl suture.  The cervix entered into the vagina at an angle.  With the securing of an angle, there appeared to be an  angle of the left, the vaginal cuff that was open was noted.  That was then utilized to use the right angle scissors to circumferentially detach the cervix from the vagina.  Once that was done, it was then noted that there was incomplete removal of the  cervix.  The vaginal cuff was therefore opened and the additional pieces of cervical tissue were removed.  Posteriorly, the  peritoneum was bleeding and individual figure-of-eight 3-0 Vicryl suture was placed. A modified Richardson vaginal angle was done on the right and on the left  with the angle being placed, exudate of the blackened old blood was noted, which then suggested that there was still a  small piece of cervical tissue remaining as mucus was also encountered.  At that point that area was resected and the vagina was then  closed with running locked stitch of the vaginal cuff, followed by closure with individual 0 Vicryl figure-of-eight sutures.  The ureters were palpated to be normal bilaterally.  Bleeding on both pedicles of the ovaries resulted in additional clamping  and suture ligation of the pedicles bilaterally and achieving hemostasis bilaterally.  Once this was done, the abdomen was irrigated, suctioned of debris.  The bladder area was inspected.  The vaginal cuff was inspected.  The uterosacral ligament was not well defined and thus not clamped separately.  The ovarian pedicles were then suspended to the round ligaments bilaterally and Arista potato starch was placed overlying the vaginal cuff.  Exploration of the upper abdomen was done.  Normal liver  edge, normal palpable kidneys noted.  The packings and the Alexis retractor was removed.  The parietal peritoneum was then closed with 2-0 Vicryl.  The rectus fascia was closed with 0 Vicryl x2.  The deep subcutaneous area was irrigated, small bleeders cauterized, interrupted 2-0 plain sutures placed and 4-0 Vicryl subcuticular closure was then done on the skin.  Due to the large subcutaneous area, a Prevena wound VAC was placed.  SPECIMEN:  Uterus, cervix, fallopian tubes sent to pathology.  Estimated weight over 1100 g.  INTRAOPERATIVE FLUID:  3L LR, 1 unit Hespan.  ESTIMATED BLOOD LOSS:  700 mL.  URINE OUTPUT:  300 mL.  COUNTS:  Sponge and instrument counts x2 was correct.  COMPLICATIONS:  None.  DISPOSITION:  The patient tolerated the procedure well and was transferred to the recovery room in stable condition.  VN/NUANCE  D:03/13/2020 T:03/13/2020 JOB:[id number]

## 2020-03-13 NOTE — Plan of Care (Signed)
  Problem: Skin Integrity: Goal: Demonstration of wound healing without infection will improve Outcome: Progressing   

## 2020-03-13 NOTE — Anesthesia Postprocedure Evaluation (Signed)
Anesthesia Post Note  Patient: Harold Barban  Procedure(s) Performed: HYSTERECTOMY ABDOMINAL WITH BILATERAL SALPINGECTOMY (N/A )     Patient location during evaluation: Other Anesthesia Type: General Level of consciousness: awake and alert Pain management: pain level controlled Vital Signs Assessment: post-procedure vital signs reviewed and stable Respiratory status: spontaneous breathing, nonlabored ventilation, respiratory function stable and patient connected to nasal cannula oxygen Cardiovascular status: blood pressure returned to baseline and stable Postop Assessment: no apparent nausea or vomiting Anesthetic complications: no    Last Vitals:  Vitals:   03/13/20 0440 03/13/20 0507  BP:  117/60  Pulse:  67  Resp: 15 18  Temp:  37.6 C  SpO2:  100%    Last Pain:  Vitals:   03/13/20 0507  TempSrc: Oral  PainSc:                  Effie Berkshire

## 2020-03-13 NOTE — Progress Notes (Signed)
Subjective: Patient reports tolerating PO, + flatus, and no problems voiding.    Objective: I have reviewed patient's vital signs.  vital signs, intake and output, and labs. Vitals:   03/13/20 0940 03/13/20 1300  BP: 124/63 119/65  Pulse: 80 75  Resp: 20 20  Temp: 98.2 F (36.8 C) 98.2 F (36.8 C)  SpO2: 98% 98%   I/O last 3 completed shifts: In: [id number] [I.V.:3000; IV Piggyback:650] Out: [id number] [Urine:2525; Other:200; Blood:700] Total I/O In: -  Out: 425 [Urine:425]  Lab Results  Component Value Date   WBC 8.9 03/13/2020   HGB 10.4 (L) 03/13/2020   HCT 32.9 (L) 03/13/2020   MCV 85.0 03/13/2020   PLT 228 03/13/2020   Lab Results  Component Value Date   CREATININE 0.81 03/13/2020    EXAM General: alert, cooperative, and no distress Resp: clear to auscultation bilaterally Cardio: regular rate and rhythm, S1, S2 normal, no murmur, click, rub or gallop GI:  soft obese non distended primary dressing wound vac in place Extremities: no edema, redness or tenderness in the calves or thighs Vaginal Bleeding: none  Assessment: s/p Procedure(s): HYSTERECTOMY ABDOMINAL WITH BILATERAL SALPINGECTOMY: stable, progressing well, and tolerating diet  Plan: Advance diet Encourage ambulation Advance to PO medication Discontinue IV fluids  LOS: 1 day    Marvene Staff, MD 03/13/2020 2:51 PM    03/13/2020, 2:51 PM

## 2020-03-14 DIAGNOSIS — N924 Excessive bleeding in the premenopausal period: Secondary | ICD-10-CM | POA: Diagnosis present

## 2020-03-14 MED ORDER — OXYCODONE HCL 5 MG PO TABS: 5.0000 mg | ORAL_TABLET | ORAL | 0 refills | Status: AC | PRN

## 2020-03-14 MED ORDER — IBUPROFEN 800 MG PO TABS: 800.0000 mg | ORAL_TABLET | Freq: Four times a day (QID) | ORAL | 6 refills | Status: DC | PRN

## 2020-03-14 MED ORDER — SIMETHICONE 80 MG PO CHEW: 80.0000 mg | CHEWABLE_TABLET | Freq: Four times a day (QID) | ORAL | 0 refills | Status: DC | PRN

## 2020-03-14 NOTE — Progress Notes (Signed)
Discharged patient to home, AVS given and explained.Questions and concerns addressed to patient's satisfaction. Belongings returned accordingly.

## 2020-03-14 NOTE — Discharge Summary (Signed)
Physician Discharge Summary  Patient ID: Charlottie Peragine MRN: [id number] DOB/AGE: 22-Jun-1966 54 y.o.  Admit date: 03/12/2020 Discharge date: 03/14/2020  Admission Diagnoses: symptomatic uterine fibroids, abnormal Perimenopausal bleeding, morbid obesity  Discharge Diagnoses: same Active Problems:   Uterine fibroid   Status post total hysterectomy Pre-diabetes  Discharged Condition: stable  Hospital Course: pt underwent exp lap, TAH, bilateral salpingectomy. Uncomplicated postoperative course  Consults: None  Significant Diagnostic Studies: labs:  CBC Latest Ref Rng & Units 03/13/2020 03/05/2020 08/27/2019  WBC 4.0 - 10.5 K/uL 8.9 7.2 15.1(H)  Hemoglobin 12.0 - 15.0 g/dL 10.4(L) 12.5 11.6(L)  Hematocrit 36.0 - 46.0 % 32.9(L) 40.1 37.0  Platelets 150 - 400 K/uL 228 276 279   BMP Latest Ref Rng & Units 03/13/2020 03/05/2020 08/27/2019  Glucose 70 - 99 mg/dL 98 87 102(H)  BUN 6 - 20 mg/dL 10 13 13   Creatinine 0.44 - 1.00 mg/dL 0.81 0.80 1.12(H)  Sodium 135 - 145 mmol/L 137 138 135  Potassium 3.5 - 5.1 mmol/L 3.8 4.0 4.1  Chloride 98 - 111 mmol/L 105 105 102  CO2 22 - 32 mmol/L 25 26 25   Calcium 8.9 - 10.3 mg/dL 8.5(L) 9.2 8.6(L)   Met   Treatments: surgery: exp lap, TAH, bilateral salpingectomy  Discharge Exam: Blood pressure 124/65, pulse 76, temperature 98.1 F (36.7 C), temperature source Oral, resp. rate 17, height 5' 7"  (1.702 m), weight (!) 162.4 kg, SpO2 98 %. General appearance: alert, cooperative, and no distress Resp: clear to auscultation bilaterally Cardio: regular rate and rhythm, S1, S2 normal, no murmur, click, rub or gallop GI: soft obese active BS. Non distended. Wound vac in place Pelvic: deferred Extremities: no edema, redness or tenderness in the calves or thighs  Disposition: Discharge disposition: 01-Home or Self Care       Discharge Instructions     Call MD for:  persistant nausea and vomiting   Complete by: As directed    Call MD  for:  redness, tenderness, or signs of infection (pain, swelling, redness, odor or green/yellow discharge around incision site)   Complete by: As directed    Call MD for:  temperature >100.4   Complete by: As directed    Diet Carb Modified   Complete by: As directed    Discharge instructions   Complete by: As directed    Call if temperature greater than equal to 100.4, nothing per vagina for 4-6 weeks or severe nausea vomiting, increased incisional pain , drainage or redness in the incision site, no straining with bowel movements, showers no bath   Increase activity slowly   Complete by: As directed       Allergies as of 03/14/2020       Reactions   Peanut-containing Drug Products Hives, Itching, Swelling   Eyes, mouth. throat   Shellfish Allergy Hives        Medication List     STOP taking these medications    HYDROcodone-acetaminophen 5-325 MG tablet Commonly known as: NORCO/VICODIN   Rybelsus 3 MG Tabs Generic drug: Semaglutide   vitamin E 180 MG (400 UNITS) capsule       TAKE these medications    B-complex with vitamin C tablet Take 1 tablet by mouth daily.   Biotin 10000 MCG Tabs Take 10,000 mcg by mouth daily.   calcium carbonate 500 MG chewable tablet Commonly known as: TUMS - dosed in mg elemental calcium Chew 2 tablets by mouth daily as needed for indigestion or heartburn.   cholecalciferol 25  MCG (1000 UNIT) tablet Commonly known as: VITAMIN D Take 2,000 Units by mouth daily.   Combigan 0.2-0.5 % ophthalmic solution Generic drug: brimonidine-timolol Place 1 drop into both eyes 2 (two) times daily.   diphenhydrAMINE 25 mg capsule Commonly known as: BENADRYL Take 25 mg by mouth every 6 (six) hours as needed for itching, allergies or sleep.   ibuprofen 800 MG tablet Commonly known as: ADVIL Take 1 tablet (800 mg total) by mouth every 6 (six) hours as needed for moderate pain or cramping. What changed:  medication strength how much to  take when to take this reasons to take this   loratadine 10 MG tablet Commonly known as: CLARITIN Take 10 mg by mouth daily as needed for allergies.   MULTIVITAMIN ADULTS PO Take 1 packet by mouth once a week. Women's Ultra Mega Whole Body   oxyCODONE 5 MG immediate release tablet Commonly known as: Oxy IR/ROXICODONE Take 1-2 tablets (5-10 mg total) by mouth every 4 (four) hours as needed for up to 7 days for moderate pain.   simethicone 80 MG chewable tablet Commonly known as: MYLICON Chew 1 tablet (80 mg total) by mouth 4 (four) times daily as needed for flatulence.   VITAMIN B-12 PO Take 1 mL by mouth daily.   Zinc 50 MG Tabs Take 50 mg by mouth daily.       Follow-up Information     Servando Salina, MD Follow up in 6 week(s).   Specialty: Obstetrics and Gynecology Why: call monday for vacuum removal in office on Friday Contact information: [address] [address] [phone]            Signed: Alanda Slim A Rebecka Oelkers 03/14/2020, 1:22 PM

## 2020-03-14 NOTE — Progress Notes (Signed)
Subjective: Patient reports + flatus and no problems voiding.    Objective: I have reviewed patient's vital signs.  vital signs and labs. Vitals:   03/13/20 2010 03/14/20 0347  BP: 110/76 124/65  Pulse: 88 76  Resp: 18 17  Temp: 99.4 F (37.4 C) 98.1 F (36.7 C)  SpO2: 99% 98%   I/O last 3 completed shifts: In: 858.2 [I.V.:858.2] Out: 3525 [id number] Total I/O In: 277 [P.O.:277] Out: -   Lab Results  Component Value Date   WBC 8.9 03/13/2020   HGB 10.4 (L) 03/13/2020   HCT 32.9 (L) 03/13/2020   MCV 85.0 03/13/2020   PLT 228 03/13/2020   Lab Results  Component Value Date   CREATININE 0.81 03/13/2020    EXAM General: alert, cooperative, and no distress Resp: clear to auscultation bilaterally Cardio: regular rate and rhythm, S1, S2 normal, no murmur, click, rub or gallop GI: incision: wound vac in place  and soft obese active BS. Surgical tender Extremities: no edema, redness or tenderness in the calves or thighs Vaginal Bleeding: none  Assessment: s/p Procedure(s): HYSTERECTOMY ABDOMINAL WITH BILATERAL SALPINGECTOMY: stable, progressing well, and tolerating diet Morbid obesity Pre-diabetes Plan: Discontinue IV fluids Discharge home D/c home D/c instructions reviewed. F/u Friday for wound vac removal, 6 wk postop  LOS: 2 days    Marvene Staff, MD 03/14/2020 1:16 PM    03/14/2020, 1:16 PM

## 2020-03-15 LAB — SURGICAL PATHOLOGY

## 2020-03-17 ENCOUNTER — Other Ambulatory Visit: Payer: Self-pay | Admitting: Obstetrics and Gynecology

## 2020-03-19 DIAGNOSIS — D252 Subserosal leiomyoma of uterus: Secondary | ICD-10-CM | POA: Diagnosis not present

## 2020-04-01 ENCOUNTER — Other Ambulatory Visit: Payer: Self-pay | Admitting: Internal Medicine

## 2020-04-01 DIAGNOSIS — R7309 Other abnormal glucose: Secondary | ICD-10-CM | POA: Diagnosis not present

## 2020-04-01 DIAGNOSIS — M79605 Pain in left leg: Secondary | ICD-10-CM

## 2020-04-01 DIAGNOSIS — E669 Obesity, unspecified: Secondary | ICD-10-CM | POA: Diagnosis not present

## 2020-04-05 ENCOUNTER — Ambulatory Visit
Admission: RE | Admit: 2020-04-05 | Discharge: 2020-04-05 | Disposition: A | Discharge status: Home / Self Care | Payer: BC Managed Care – PPO | Source: Ambulatory Visit | Attending: Internal Medicine | Admitting: Internal Medicine

## 2020-04-05 DIAGNOSIS — I83812 Varicose veins of left lower extremities with pain: Secondary | ICD-10-CM | POA: Diagnosis not present

## 2020-04-05 DIAGNOSIS — M79605 Pain in left leg: Secondary | ICD-10-CM

## 2020-04-15 DIAGNOSIS — Z90710 Acquired absence of both cervix and uterus: Secondary | ICD-10-CM | POA: Diagnosis not present

## 2020-06-01 DIAGNOSIS — E559 Vitamin D deficiency, unspecified: Secondary | ICD-10-CM | POA: Diagnosis not present

## 2020-06-01 DIAGNOSIS — N951 Menopausal and female climacteric states: Secondary | ICD-10-CM | POA: Diagnosis not present

## 2020-06-01 DIAGNOSIS — E7800 Pure hypercholesterolemia, unspecified: Secondary | ICD-10-CM | POA: Diagnosis not present

## 2020-06-01 DIAGNOSIS — R635 Abnormal weight gain: Secondary | ICD-10-CM | POA: Diagnosis not present

## 2020-06-03 DIAGNOSIS — Z1339 Encounter for screening examination for other mental health and behavioral disorders: Secondary | ICD-10-CM | POA: Diagnosis not present

## 2020-06-03 DIAGNOSIS — E7800 Pure hypercholesterolemia, unspecified: Secondary | ICD-10-CM | POA: Diagnosis not present

## 2020-06-03 DIAGNOSIS — M2559 Pain in other specified joint: Secondary | ICD-10-CM | POA: Diagnosis not present

## 2020-06-03 DIAGNOSIS — E559 Vitamin D deficiency, unspecified: Secondary | ICD-10-CM | POA: Diagnosis not present

## 2020-06-03 DIAGNOSIS — Z1331 Encounter for screening for depression: Secondary | ICD-10-CM | POA: Diagnosis not present

## 2020-06-23 ENCOUNTER — Other Ambulatory Visit: Payer: Self-pay | Admitting: Internal Medicine

## 2020-06-23 DIAGNOSIS — Z1231 Encounter for screening mammogram for malignant neoplasm of breast: Secondary | ICD-10-CM

## 2020-06-25 DIAGNOSIS — Z6841 Body Mass Index (BMI) 40.0 and over, adult: Secondary | ICD-10-CM | POA: Diagnosis not present

## 2020-06-25 DIAGNOSIS — E7800 Pure hypercholesterolemia, unspecified: Secondary | ICD-10-CM | POA: Diagnosis not present

## 2020-07-07 DIAGNOSIS — E559 Vitamin D deficiency, unspecified: Secondary | ICD-10-CM | POA: Diagnosis not present

## 2020-07-07 DIAGNOSIS — E7800 Pure hypercholesterolemia, unspecified: Secondary | ICD-10-CM | POA: Diagnosis not present

## 2020-07-19 DIAGNOSIS — Z6841 Body Mass Index (BMI) 40.0 and over, adult: Secondary | ICD-10-CM | POA: Diagnosis not present

## 2020-07-19 DIAGNOSIS — R030 Elevated blood-pressure reading, without diagnosis of hypertension: Secondary | ICD-10-CM | POA: Diagnosis not present

## 2020-10-04 ENCOUNTER — Ambulatory Visit: Payer: BC Managed Care – PPO

## 2020-10-06 ENCOUNTER — Ambulatory Visit
Admission: RE | Admit: 2020-10-06 | Discharge: 2020-10-06 | Disposition: A | Discharge status: Home / Self Care | Payer: BC Managed Care – PPO | Source: Ambulatory Visit | Attending: Internal Medicine | Admitting: Internal Medicine

## 2020-10-06 ENCOUNTER — Other Ambulatory Visit: Payer: Self-pay

## 2020-10-06 DIAGNOSIS — Z1231 Encounter for screening mammogram for malignant neoplasm of breast: Secondary | ICD-10-CM | POA: Diagnosis not present

## 2020-10-07 DIAGNOSIS — Z0000 Encounter for general adult medical examination without abnormal findings: Secondary | ICD-10-CM | POA: Diagnosis not present

## 2020-10-07 DIAGNOSIS — R7309 Other abnormal glucose: Secondary | ICD-10-CM | POA: Diagnosis not present

## 2020-10-07 DIAGNOSIS — E559 Vitamin D deficiency, unspecified: Secondary | ICD-10-CM | POA: Diagnosis not present

## 2020-10-07 DIAGNOSIS — R635 Abnormal weight gain: Secondary | ICD-10-CM | POA: Diagnosis not present

## 2020-10-07 DIAGNOSIS — Z01411 Encounter for gynecological examination (general) (routine) with abnormal findings: Secondary | ICD-10-CM | POA: Diagnosis not present

## 2020-10-07 DIAGNOSIS — Z6841 Body Mass Index (BMI) 40.0 and over, adult: Secondary | ICD-10-CM | POA: Diagnosis not present

## 2020-10-19 DIAGNOSIS — H401131 Primary open-angle glaucoma, bilateral, mild stage: Secondary | ICD-10-CM | POA: Diagnosis not present

## 2021-02-23 ENCOUNTER — Other Ambulatory Visit: Payer: Self-pay

## 2021-02-23 ENCOUNTER — Encounter (INDEPENDENT_AMBULATORY_CARE_PROVIDER_SITE_OTHER): Payer: Self-pay | Admitting: Bariatrics

## 2021-02-23 ENCOUNTER — Ambulatory Visit (INDEPENDENT_AMBULATORY_CARE_PROVIDER_SITE_OTHER): Payer: BC Managed Care – PPO | Admitting: Bariatrics

## 2021-02-23 VITALS — BP 141/83 | HR 71 | Temp 98.0°F | Ht 68.0 in | Wt 360.0 lb

## 2021-02-23 DIAGNOSIS — E559 Vitamin D deficiency, unspecified: Secondary | ICD-10-CM | POA: Diagnosis not present

## 2021-02-23 DIAGNOSIS — R0602 Shortness of breath: Secondary | ICD-10-CM

## 2021-02-23 DIAGNOSIS — D508 Other iron deficiency anemias: Secondary | ICD-10-CM

## 2021-02-23 DIAGNOSIS — Z9189 Other specified personal risk factors, not elsewhere classified: Secondary | ICD-10-CM

## 2021-02-23 DIAGNOSIS — R5383 Other fatigue: Secondary | ICD-10-CM | POA: Diagnosis not present

## 2021-02-23 DIAGNOSIS — Z1331 Encounter for screening for depression: Secondary | ICD-10-CM

## 2021-02-23 DIAGNOSIS — Z6841 Body Mass Index (BMI) 40.0 and over, adult: Secondary | ICD-10-CM

## 2021-02-23 DIAGNOSIS — R7303 Prediabetes: Secondary | ICD-10-CM | POA: Diagnosis not present

## 2021-02-23 DIAGNOSIS — Z0289 Encounter for other administrative examinations: Secondary | ICD-10-CM

## 2021-02-24 LAB — CBC WITH DIFFERENTIAL/PLATELET
Basophils Absolute: 0 10*3/uL (ref 0.0–0.2)
Basos: 1 %
EOS (ABSOLUTE): 0.2 10*3/uL (ref 0.0–0.4)
Eos: 3 %
Hematocrit: 38.4 % (ref 34.0–46.6)
Hemoglobin: 12.5 g/dL (ref 11.1–15.9)
Immature Grans (Abs): 0 10*3/uL (ref 0.0–0.1)
Immature Granulocytes: 0 %
Lymphocytes Absolute: 2.1 10*3/uL (ref 0.7–3.1)
Lymphs: 33 %
MCH: 26.9 pg (ref 26.6–33.0)
MCHC: 32.6 g/dL (ref 31.5–35.7)
MCV: 83 fL (ref 79–97)
Monocytes Absolute: 0.7 10*3/uL (ref 0.1–0.9)
Monocytes: 11 %
Neutrophils Absolute: 3.3 10*3/uL (ref 1.4–7.0)
Neutrophils: 52 %
Platelets: 266 10*3/uL (ref 150–450)
RBC: 4.64 x10E6/uL (ref 3.77–5.28)
RDW: 12.3 % (ref 11.7–15.4)
WBC: 6.3 10*3/uL (ref 3.4–10.8)

## 2021-02-24 LAB — VITAMIN D 25 HYDROXY (VIT D DEFICIENCY, FRACTURES): Vit D, 25-Hydroxy: 30.2 ng/mL (ref 30.0–100.0)

## 2021-02-24 LAB — LIPID PANEL WITH LDL/HDL RATIO
Cholesterol, Total: 161 mg/dL (ref 100–199)
HDL: 49 mg/dL (ref >39)
LDL Chol Calc (NIH): 99 mg/dL (ref 0–99)
LDL/HDL Ratio: 2 ratio (ref 0.0–3.2)
Triglycerides: 65 mg/dL (ref 0–149)
VLDL Cholesterol Cal: 13 mg/dL (ref 5–40)

## 2021-02-24 LAB — COMPREHENSIVE METABOLIC PANEL
ALT: 31 IU/L (ref 0–32)
AST: 20 IU/L (ref 0–40)
Albumin/Globulin Ratio: 1.2 (ref 1.2–2.2)
Albumin: 4.2 g/dL (ref 3.8–4.9)
Alkaline Phosphatase: 72 IU/L (ref 44–121)
BUN/Creatinine Ratio: 13 (ref 9–23)
BUN: 12 mg/dL (ref 6–24)
Bilirubin Total: 0.4 mg/dL (ref 0.0–1.2)
CO2: 22 mmol/L (ref 20–29)
Calcium: 9.4 mg/dL (ref 8.7–10.2)
Chloride: 103 mmol/L (ref 96–106)
Creatinine, Ser: 0.89 mg/dL (ref 0.57–1.00)
Globulin, Total: 3.5 g/dL (ref 1.5–4.5)
Glucose: 84 mg/dL (ref 65–99)
Potassium: 4.4 mmol/L (ref 3.5–5.2)
Sodium: 143 mmol/L (ref 134–144)
Total Protein: 7.7 g/dL (ref 6.0–8.5)
eGFR: 77 mL/min/{1.73_m2} (ref >59)

## 2021-02-24 LAB — T3: T3, Total: 139 ng/dL (ref 71–180)

## 2021-02-24 LAB — T4, FREE: Free T4: 1.22 ng/dL (ref 0.82–1.77)

## 2021-02-24 LAB — HEMOGLOBIN A1C
Est. average glucose Bld gHb Est-mCnc: 120 mg/dL
Hgb A1c MFr Bld: 5.8 % — ABNORMAL HIGH (ref 4.8–5.6)

## 2021-02-24 LAB — TSH: TSH: 2.04 u[IU]/mL (ref 0.450–4.500)

## 2021-02-24 LAB — INSULIN, RANDOM: INSULIN: 12.7 u[IU]/mL (ref 2.6–24.9)

## 2021-02-28 ENCOUNTER — Encounter (INDEPENDENT_AMBULATORY_CARE_PROVIDER_SITE_OTHER): Payer: Self-pay | Admitting: Bariatrics

## 2021-02-28 NOTE — Progress Notes (Signed)
Chief Complaint:   OBESITY Kelly Ward (MR# [id number]) is a 55 y.o. female who presents for evaluation and treatment of obesity and related comorbidities. Current BMI is Body mass index is 54.74 kg/m. Kelly Ward has been struggling with her weight for many years and has been unsuccessful in either losing weight, maintaining weight loss, or reaching her healthy weight goal.  Kelly Ward is currently in the action stage of change and ready to dedicate time achieving and maintaining a healthier weight. Kelly Ward is interested in becoming our patient and working on intensive lifestyle modifications including (but not limited to) diet and exercise for weight loss.  Kelly Ward likes to [employer].  She sometimes considers herself to be a picky eater.  She has food allergies (shellfish and peanuts).  Kelly Ward's habits were reviewed today and are as follows: Her family eats meals together, she thinks her family will eat healthier with her, her desired weight loss is 175 pounds, she has been heavy most of her life, her heaviest weight ever was her current weight, she is a picky eater and doesn't like to eat healthier foods, she skips breakfast, lunch, or dinner frequently, she is frequently drinking liquids with calories, she frequently makes poor food choices, she has problems with excessive hunger, she frequently eats larger portions than normal and she struggles with emotional eating.  Depression Screen Kelly Ward's Food and Mood (modified PHQ-9) score was 3.  Depression screen [facility] 2/9 02/23/2021  Decreased Interest 0  Down, Depressed, Hopeless 0  PHQ - 2 Score 0  Altered sleeping 0  Tired, decreased energy 1  Change in appetite 2  Feeling bad or failure about yourself  0  Trouble concentrating 0  Moving slowly or fidgety/restless 0  Suicidal thoughts 0  PHQ-9 Score 3  Difficult doing work/chores Not difficult at all   Subjective:   1. Other fatigue Kelly Ward denies daytime somnolence and denies  waking up still tired. Patent has a history of symptoms of snoring. [name] generally gets 5 or 6 hours of sleep per night, and states that she has generally restful sleep. Snoring is present. Apneic episodes are not present. Epworth Sleepiness Score is 4.  2. SOB (shortness of breath) on exertion Kelly Ward notes increasing shortness of breath with exercising and seems to be worsening over time with weight gain. She notes getting out of breath sooner with activity than she used to. This has not gotten worse recently. Kelly Ward denies shortness of breath at rest or orthopnea.  3. Other iron deficiency anemia Kelly Ward is taking an iron supplement.  CBC Latest Ref Rng & Units 02/23/2021 03/13/2020 03/05/2020  WBC 3.4 - 10.8 x10E3/uL 6.3 8.9 7.2  Hemoglobin 11.1 - 15.9 g/dL 12.5 10.4(L) 12.5  Hematocrit 34.0 - 46.6 % 38.4 32.9(L) 40.1  Platelets 150 - 450 x10E3/uL 266 228 276   4. Prediabetes Kelly Ward has a diagnosis of prediabetes based on her elevated HgA1c and was informed this puts her at greater risk of developing diabetes. She continues to work on diet and exercise to decrease her risk of diabetes. She denies nausea or hypoglycemia.    Lab Results  Component Value Date   HGBA1C 5.8 (H) 02/23/2021   Lab Results  Component Value Date   INSULIN 12.7 02/23/2021   5. Vitamin D deficiency She is currently taking OTC vitamin D 2,000 IU each day. She denies nausea, vomiting or muscle weakness.  6. Depression screen [name] was screened for depression as part of her new patient workup today.  PHQ-9 is 3.  7. At risk for activity intolerance [name] is at risk for activity intolerance due to obesity and anemia.  Assessment/Plan:   1. Other fatigue Kelly Ward does feel that her weight is causing her energy to be lower than it should be. Fatigue may be related to obesity, depression or many other causes. Labs will be ordered, and in the meanwhile, Kelly Ward will focus on self care including making  healthy food choices, increasing physical activity and focusing on stress reduction.  Gradually increase activity.  Will check thyroid panel today.  Will check EKG and labs today.  - EKG 12-Lead - CBC with Differential/Platelet - Comprehensive metabolic panel - Lipid Panel With LDL/HDL Ratio - T3 - T4, free - TSH  2. SOB (shortness of breath) on exertion [name] does not feel that she gets out of breath more easily that she used to when she exercises. Kelly Ward's shortness of breath appears to be obesity related and exercise induced. She has agreed to work on weight loss and gradually increase exercise to treat her exercise induced shortness of breath. Will continue to monitor closely.  Will check IC today.  3. Other iron deficiency anemia Will check CBC.  Continue iron supplement.    Counseling . Iron is essential for our bodies to make red blood cells.  Reasons that someone may be deficient include: an iron-deficient diet (more likely in those following vegan or vegetarian diets), women with heavy menses, patients with GI disorders or poor absorption, patients that have had bariatric surgery, frequent blood donors, patients with cancer, and patients with heart disease.   [name] Noble foods include dark leafy greens, red and white meats, eggs, seafood, and beans.   . Certain foods and drinks prevent your body from absorbing iron properly. Avoid eating these foods in the same meal as iron-rich foods or with iron supplements. These foods include: coffee, black tea, and red wine; milk, dairy products, and foods that are high in calcium; beans and soybeans; whole grains.  . Constipation can be a side effect of iron supplementation. Increased water and fiber intake are helpful. Water goal: > 2 liters/day. Fiber goal: > 25 grams/day.  - CBC with Differential/Platelet  4. Prediabetes Kelly Ward will continue to work on weight loss, exercise, and decreasing simple carbohydrates to help decrease the risk  of diabetes.  Will check A1c, insulin level, CMP, and lipid panel today.  - Comprehensive metabolic panel - Hemoglobin A1c - Insulin, random - Lipid Panel With LDL/HDL Ratio  5. Vitamin D deficiency Low Vitamin D level contributes to fatigue and are associated with obesity, breast, and colon cancer. She agrees to continue to take OTC vitamin D 2,000 IU daily and we will check her vitamin D level today, as per below.  - VITAMIN D 25 Hydroxy (Vit-D Deficiency, Fractures)  6. Depression screen Depression screen is negative.  7. At risk for activity intolerance Magan was given approximately 15 minutes of exercise intolerance counseling today. She is 55 y.o. female and has risk factors exercise intolerance including obesity. We discussed intensive lifestyle modifications today with an emphasis on specific weight loss instructions and strategies. Graycee will slowly increase activity as tolerated.  Repetitive spaced learning was employed today to elicit superior memory formation and behavioral change.  8. Class 3 severe obesity due to excess calories with serious comorbidity and body mass index (BMI) of 50.0 to 59.9 in adult [facility])  Addison is currently in the action stage of change and her goal is to continue  with weight loss efforts. I recommend Tejah begin the structured treatment plan as follows:  She has agreed to the Category 3 Plan.  She will work on meal planning, will stop skipping meals, and will stop sugary drinks.  Labs from 03/13/2020, including CMP, CBC (glucose), were reviewed today.  Exercise goals: No exercise has been prescribed at this time.   Behavioral modification strategies: increasing lean protein intake, decreasing simple carbohydrates, increasing vegetables, increasing water intake, decreasing eating out, no skipping meals, meal planning and cooking strategies, keeping healthy foods in the home and planning for success.  She was informed of the importance of  frequent follow-up visits to maximize her success with intensive lifestyle modifications for her multiple health conditions. She was informed we would discuss her lab results at her next visit unless there is a critical issue that needs to be addressed sooner. Margrett agreed to keep her next visit at the agreed upon time to discuss these results.  Objective:   Blood pressure (!) 141/83, pulse 71, temperature 98 F (36.7 C), height 5\' 8"  (1.727 m), weight (!) 360 lb (163.3 kg), last menstrual period 02/09/2020, SpO2 98 %. Body mass index is 54.74 kg/m.  EKG: Normal sinus rhythm, rate 69 bpm.  Indirect Calorimeter completed today shows a VO2 of 343 and a REE of 2386.  Her calculated basal metabolic rate is 1610 thus her basal metabolic rate is better than expected.  General: Cooperative, alert, well developed, in no acute distress. HEENT: Conjunctivae and lids unremarkable. Cardiovascular: Regular rhythm.  Lungs: Normal work of breathing. Neurologic: No focal deficits.   Lab Results  Component Value Date   CREATININE 0.89 02/23/2021   BUN 12 02/23/2021   NA 143 02/23/2021   K 4.4 02/23/2021   CL 103 02/23/2021   CO2 22 02/23/2021   Lab Results  Component Value Date   ALT 31 02/23/2021   AST 20 02/23/2021   ALKPHOS 72 02/23/2021   BILITOT 0.4 02/23/2021   Lab Results  Component Value Date   HGBA1C 5.8 (H) 02/23/2021   HGBA1C 5.4 03/05/2020   Lab Results  Component Value Date   INSULIN 12.7 02/23/2021   Lab Results  Component Value Date   TSH 2.040 02/23/2021   Lab Results  Component Value Date   CHOL 161 02/23/2021   HDL 49 02/23/2021   LDLCALC 99 02/23/2021   TRIG 65 02/23/2021   Lab Results  Component Value Date   WBC 6.3 02/23/2021   HGB 12.5 02/23/2021   HCT 38.4 02/23/2021   MCV 83 02/23/2021   PLT 266 02/23/2021   Attestation Statements:   Reviewed by clinician on day of visit: allergies, medications, problem list, medical history, surgical history,  family history, social history, and previous encounter notes.  I, Water quality scientist, CMA, am acting as Location manager for [employer], DO  I have reviewed the above documentation for accuracy and completeness, and I agree with the above. Jearld Lesch, DO

## 2021-03-09 ENCOUNTER — Ambulatory Visit (INDEPENDENT_AMBULATORY_CARE_PROVIDER_SITE_OTHER): Payer: BC Managed Care – PPO | Admitting: Bariatrics

## 2021-03-09 ENCOUNTER — Encounter (INDEPENDENT_AMBULATORY_CARE_PROVIDER_SITE_OTHER): Payer: Self-pay | Admitting: Bariatrics

## 2021-03-09 ENCOUNTER — Other Ambulatory Visit: Payer: Self-pay

## 2021-03-09 VITALS — BP 146/82 | HR 76 | Temp 97.8°F | Ht 68.0 in | Wt 366.0 lb

## 2021-03-09 DIAGNOSIS — E559 Vitamin D deficiency, unspecified: Secondary | ICD-10-CM

## 2021-03-09 DIAGNOSIS — Z6841 Body Mass Index (BMI) 40.0 and over, adult: Secondary | ICD-10-CM | POA: Diagnosis not present

## 2021-03-09 DIAGNOSIS — Z9189 Other specified personal risk factors, not elsewhere classified: Secondary | ICD-10-CM | POA: Diagnosis not present

## 2021-03-09 DIAGNOSIS — R7303 Prediabetes: Secondary | ICD-10-CM | POA: Diagnosis not present

## 2021-03-09 MED ORDER — VITAMIN D (ERGOCALCIFEROL) 1.25 MG (50000 UNIT) PO CAPS: 50000.0000 [IU] | ORAL_CAPSULE | ORAL | 0 refills | Status: DC

## 2021-03-14 ENCOUNTER — Encounter (INDEPENDENT_AMBULATORY_CARE_PROVIDER_SITE_OTHER): Payer: Self-pay | Admitting: Bariatrics

## 2021-03-14 NOTE — Progress Notes (Signed)
Chief Complaint:   OBESITY Kelly Ward is here to discuss her progress with her obesity treatment plan along with follow-up of her obesity related diagnoses. Kelly Ward is on the Category 3 Plan and states she is following her eating plan approximately 30% of the time. Kelly Ward states she is doing yard work and walking 5,000 steps 12 hours 1/5 times per week.  Today's visit was #: 2 Starting weight: 360 lbs Starting date: 02/23/2021 Today's weight: 366 lbs Today's date: 03/09/2021 Total lbs lost to date: 0 Total lbs lost since last in-office visit: 0  Interim History: Kelly Ward is up 6 lbs since her last visit. She is taking Wegovy. She did not feel the plan at all.  Subjective:   1. Pre-diabetes Kelly Ward is taking [place]. Her A1c is 5.8 and insulin level 12.1.  Lab Results  Component Value Date   HGBA1C 5.8 (H) 02/23/2021   Lab Results  Component Value Date   INSULIN 12.7 02/23/2021    2. Vitamin D insufficiency Kelly Ward has a Vit D level of 30.2. She is taking OTC Vit D 2,000 IU daily.  3. At risk for diabetes mellitus Kelly Ward is at higher than average risk for developing diabetes due to obesity and pre-diabetes.  Assessment/Plan:   1. Pre-diabetes Kelly Ward will continue to work on weight loss, exercise, and decreasing simple carbohydrates to help decrease the risk of diabetes. Increase protein and healthy fats.  2. Vitamin D insufficiency Low Vitamin D level contributes to fatigue and are associated with obesity, breast, and colon cancer. She agrees to start to take prescription Vitamin D @50 ,000 IU every week and will follow-up for routine testing of Vitamin D, at least 2-3 times per year to avoid over-replacement.  - Vitamin D, Ergocalciferol, (DRISDOL) 1.25 MG (50000 UNIT) CAPS capsule; Take 1 capsule (50,000 Units total) by mouth every 7 (seven) days.  Dispense: 4 capsule; Refill: 0  3. At risk for diabetes mellitus Kelly Ward was given approximately 15 minutes of diabetes  education and counseling today. We discussed intensive lifestyle modifications today with an emphasis on weight loss as well as increasing exercise and decreasing simple carbohydrates in her diet. We also reviewed medication options with an emphasis on risk versus benefit of those discussed.   Repetitive spaced learning was employed today to elicit superior memory formation and behavioral change.  4. Obesity, current BMI 55 Kelly Ward is currently in the action stage of change. As such, her goal is to continue with weight loss efforts. She has agreed to the Category 3 Plan.   1. Meal plan 2. Intentional eating 3. 02/23/2021 labs reviewed 4. Handout: Protein Equivalents 5. Continue Kelly Ward  Exercise goals: As is  Behavioral modification strategies: increasing lean protein intake, decreasing simple carbohydrates, increasing vegetables, increasing water intake, decreasing eating out, no skipping meals, meal planning and cooking strategies, keeping healthy foods in the home and planning for success.  Kelly Ward has agreed to follow-up with our clinic in 2 weeks. She was informed of the importance of frequent follow-up visits to maximize her success with intensive lifestyle modifications for her multiple health conditions.   Objective:   Blood pressure (!) 146/82, pulse 76, temperature 97.8 F (36.6 C), height 5\' 8"  (1.727 m), weight (!) 366 lb (166 kg), last menstrual period 02/09/2020, SpO2 98 %. Body mass index is 55.65 kg/m.  General: Cooperative, alert, well developed, in no acute distress. HEENT: Conjunctivae and lids unremarkable. Cardiovascular: Regular rhythm.  Lungs: Normal work of breathing. Neurologic: No focal deficits.  Lab Results  Component Value Date   CREATININE 0.89 02/23/2021   BUN 12 02/23/2021   NA 143 02/23/2021   K 4.4 02/23/2021   CL 103 02/23/2021   CO2 22 02/23/2021   Lab Results  Component Value Date   ALT 31 02/23/2021   AST 20 02/23/2021   ALKPHOS 72  02/23/2021   BILITOT 0.4 02/23/2021   Lab Results  Component Value Date   HGBA1C 5.8 (H) 02/23/2021   HGBA1C 5.4 03/05/2020   Lab Results  Component Value Date   INSULIN 12.7 02/23/2021   Lab Results  Component Value Date   TSH 2.040 02/23/2021   Lab Results  Component Value Date   CHOL 161 02/23/2021   HDL 49 02/23/2021   LDLCALC 99 02/23/2021   TRIG 65 02/23/2021   Lab Results  Component Value Date   WBC 6.3 02/23/2021   HGB 12.5 02/23/2021   HCT 38.4 02/23/2021   MCV 83 02/23/2021   PLT 266 02/23/2021    Attestation Statements:   Reviewed by clinician on day of visit: allergies, medications, problem list, medical history, surgical history, family history, social history, and previous encounter notes.  Coral Ceo, am acting as Location manager for [employer], DO.  I have reviewed the above documentation for accuracy and completeness, and I agree with the above. Jearld Lesch, DO

## 2021-03-30 ENCOUNTER — Ambulatory Visit (INDEPENDENT_AMBULATORY_CARE_PROVIDER_SITE_OTHER): Payer: BC Managed Care – PPO | Admitting: Bariatrics

## 2021-03-30 ENCOUNTER — Other Ambulatory Visit: Payer: Self-pay

## 2021-03-30 VITALS — BP 140/83 | HR 77 | Temp 98.4°F | Ht 68.0 in | Wt 357.0 lb

## 2021-03-30 DIAGNOSIS — Z9189 Other specified personal risk factors, not elsewhere classified: Secondary | ICD-10-CM

## 2021-03-30 DIAGNOSIS — Z6841 Body Mass Index (BMI) 40.0 and over, adult: Secondary | ICD-10-CM

## 2021-03-30 DIAGNOSIS — E559 Vitamin D deficiency, unspecified: Secondary | ICD-10-CM

## 2021-03-30 MED ORDER — VITAMIN D (ERGOCALCIFEROL) 1.25 MG (50000 UNIT) PO CAPS: 50000.0000 [IU] | ORAL_CAPSULE | ORAL | 0 refills | Status: DC

## 2021-03-31 ENCOUNTER — Encounter (INDEPENDENT_AMBULATORY_CARE_PROVIDER_SITE_OTHER): Payer: Self-pay | Admitting: Bariatrics

## 2021-03-31 NOTE — Progress Notes (Signed)
Chief Complaint:   OBESITY Kelly Ward is here to discuss her progress with her obesity treatment plan along with follow-up of her obesity related diagnoses. Kelly Ward is on the Category 3 Plan and states she is following her eating plan approximately 30-40% of the time. Kelly Ward states she is walking 30 minutes 2 times per week.  Today's visit was #: 3 Starting weight: 360 lbs Starting date: 02/23/2021 Today's weight: 357 lbs Today's date: 03/30/2021 Total lbs lost to date: 3 Total lbs lost since last in-office visit: 9  Interim History: Kelly Ward is down 9 lbs since her last visit. She is eating more vegetables and protein. She is getting adequate protein.  Subjective:   1. Vitamin D insufficiency Kelly Ward denies nausea, vomiting, and muscle weakness. Pt is on prescription Vit D.  2. At risk for osteoporosis Kelly Ward is at higher risk of osteopenia and osteoporosis due to Vitamin D deficiency.   Assessment/Plan:   1. Vitamin D insufficiency Low Vitamin D level contributes to fatigue and are associated with obesity, breast, and colon cancer. She agrees to continue to take prescription Vitamin D @50 ,000 IU every week and will follow-up for routine testing of Vitamin D, at least 2-3 times per year to avoid over-replacement.  - Vitamin D, Ergocalciferol, (DRISDOL) 1.25 MG (50000 UNIT) CAPS capsule; Take 1 capsule (50,000 Units total) by mouth every 7 (seven) days.  Dispense: 4 capsule; Refill: 0  2. At risk for osteoporosis Kelly Ward was given approximately 15 minutes of osteoporosis prevention counseling today. Kelly Ward is at risk for osteopenia and osteoporosis due to her Vitamin D deficiency. She was encouraged to take her Vitamin D and follow her higher calcium diet and increase strengthening exercise to help strengthen her bones and decrease her risk of osteopenia and osteoporosis.  Repetitive spaced learning was employed today to elicit superior memory formation and behavioral  change.  3. Obesity, current BMI 9  Kelly Ward is currently in the action stage of change. As such, her goal is to continue with weight loss efforts. She has agreed to the Category 3 Plan.   Meal plan Intentional eating  Exercise goals: As is  Behavioral modification strategies: increasing lean protein intake, decreasing simple carbohydrates, increasing vegetables, increasing water intake, decreasing eating out, no skipping meals, meal planning and cooking strategies, keeping healthy foods in the home and planning for success.  Kelly Ward has agreed to follow-up with our clinic in 2 weeks. She was informed of the importance of frequent follow-up visits to maximize her success with intensive lifestyle modifications for her multiple health conditions.   Objective:   Blood pressure 140/83, pulse 77, temperature 98.4 F (36.9 C), height 5\' 8"  (1.727 m), weight (!) 357 lb (161.9 kg), last menstrual period 02/09/2020, SpO2 96 %. Body mass index is 54.28 kg/m.  General: Cooperative, alert, well developed, in no acute distress. HEENT: Conjunctivae and lids unremarkable. Cardiovascular: Regular rhythm.  Lungs: Normal work of breathing. Neurologic: No focal deficits.   Lab Results  Component Value Date   CREATININE 0.89 02/23/2021   BUN 12 02/23/2021   NA 143 02/23/2021   K 4.4 02/23/2021   CL 103 02/23/2021   CO2 22 02/23/2021   Lab Results  Component Value Date   ALT 31 02/23/2021   AST 20 02/23/2021   ALKPHOS 72 02/23/2021   BILITOT 0.4 02/23/2021   Lab Results  Component Value Date   HGBA1C 5.8 (H) 02/23/2021   HGBA1C 5.4 03/05/2020   Lab Results  Component Value Date  INSULIN 12.7 02/23/2021   Lab Results  Component Value Date   TSH 2.040 02/23/2021   Lab Results  Component Value Date   CHOL 161 02/23/2021   HDL 49 02/23/2021   LDLCALC 99 02/23/2021   TRIG 65 02/23/2021   Lab Results  Component Value Date   WBC 6.3 02/23/2021   HGB 12.5 02/23/2021   HCT 38.4  02/23/2021   MCV 83 02/23/2021   PLT 266 02/23/2021   No results found for: IRON, TIBC, FERRITIN   Attestation Statements:   Reviewed by clinician on day of visit: allergies, medications, problem list, medical history, surgical history, family history, social history, and previous encounter notes.  Coral Ceo, CMA, am acting as Location manager for [employer], DO.  I have reviewed the above documentation for accuracy and completeness, and I agree with the above. Jearld Lesch, DO

## 2021-04-26 ENCOUNTER — Encounter (INDEPENDENT_AMBULATORY_CARE_PROVIDER_SITE_OTHER): Payer: Self-pay | Admitting: Bariatrics

## 2021-04-26 ENCOUNTER — Ambulatory Visit (INDEPENDENT_AMBULATORY_CARE_PROVIDER_SITE_OTHER): Payer: BC Managed Care – PPO | Admitting: Bariatrics

## 2021-04-26 ENCOUNTER — Other Ambulatory Visit: Payer: Self-pay

## 2021-04-26 VITALS — BP 135/79 | HR 67 | Temp 97.8°F | Ht 68.0 in | Wt 358.0 lb

## 2021-04-26 DIAGNOSIS — R7303 Prediabetes: Secondary | ICD-10-CM | POA: Diagnosis not present

## 2021-04-26 DIAGNOSIS — E559 Vitamin D deficiency, unspecified: Secondary | ICD-10-CM | POA: Diagnosis not present

## 2021-04-26 DIAGNOSIS — Z9189 Other specified personal risk factors, not elsewhere classified: Secondary | ICD-10-CM | POA: Diagnosis not present

## 2021-04-26 DIAGNOSIS — Z6841 Body Mass Index (BMI) 40.0 and over, adult: Secondary | ICD-10-CM

## 2021-04-26 MED ORDER — OZEMPIC (0.25 OR 0.5 MG/DOSE) 2 MG/1.5ML ~~LOC~~ SOPN: 0.5000 mg | PEN_INJECTOR | SUBCUTANEOUS | 0 refills | Status: DC

## 2021-04-26 MED ORDER — VITAMIN D (ERGOCALCIFEROL) 1.25 MG (50000 UNIT) PO CAPS: 50000.0000 [IU] | ORAL_CAPSULE | ORAL | 0 refills | Status: DC

## 2021-05-03 NOTE — Progress Notes (Signed)
Chief Complaint:   OBESITY Kelly Ward is here to discuss her progress with her obesity treatment plan along with follow-up of her obesity related diagnoses. Kelly Ward is on the Category 3 Plan and states she is following her eating plan approximately 10% of the time. [name] states she is was walking 8,000-9,000 steps 7 times per week.   Today's visit was #: 4 Starting weight: 360 lbs Starting date: 02/23/2021 Today's weight: 358 lbs Today's date: 04/26/2021 Total lbs lost to date: 2 Total lbs lost since last in-office visit: 0  Interim History: Kelly Ward is up 1 lb since her last visit. She is getting adequate water.  Subjective:   1. Pre-diabetes [name] is not on medications currently.  2. Vitamin D insufficiency [name] denies muscle weakness, nausea, or vomiting on Vit D.  3. At risk for diabetes mellitus Kelly Ward is at higher than average risk for developing diabetes due to obesity and pre-diabetes.   Assessment/Plan:   1. Pre-diabetes [name] will continue to work on weight loss, exercise, increasing healthy fats and protein, and decreasing simple carbohydrates to help decrease the risk of diabetes. [name] has a sample of Ozempic 0.25 mg and she wants to go up, and she will talk with her primary care physician about increasing Ozempic to 0.5 mg.  - Semaglutide,0.25 or 0.5MG /DOS, (OZEMPIC, 0.25 OR 0.5 MG/DOSE,) 2 MG/1.5ML SOPN; Inject 0.5 mg into the skin once a week.  Dispense: 1.5 mL; Refill: 0  2. Vitamin D insufficiency Low Vitamin D level contributes to fatigue and are associated with obesity, breast, and colon cancer. We will refill prescription Vitamin D for 1 month. [name] will follow-up for routine testing of Vitamin D, at least 2-3 times per year to avoid over-replacement.  - Vitamin D, Ergocalciferol, (DRISDOL) 1.25 MG (50000 UNIT) CAPS capsule; Take 1 capsule (50,000 Units total) by mouth every 7 (seven) days.  Dispense: 4 capsule; Refill: 0  3. At risk for  diabetes mellitus Kelly Ward was given approximately 15 minutes of diabetes education and counseling today. We discussed intensive lifestyle modifications today with an emphasis on weight loss as well as increasing exercise and decreasing simple carbohydrates in her diet. We also reviewed medication options with an emphasis on risk versus benefit of those discussed.   Repetitive spaced learning was employed today to elicit superior memory formation and behavioral change.  4. Obesity with currrent BMI of 54.5 Kelly Ward is currently in the action stage of change. As such, her goal is to continue with weight loss efforts. She has agreed to the Category 3 Plan.   Intentional eating was discussed.   Exercise goals: No exercise has been prescribed at this time, due falling twice last week.  Behavioral modification strategies: increasing lean protein intake, decreasing simple carbohydrates, increasing vegetables, increasing water intake, decreasing eating out, no skipping meals, meal planning and cooking strategies, keeping healthy foods in the home, and planning for success.  [name] has agreed to follow-up with our clinic in 2 weeks. She was informed of the importance of frequent follow-up visits to maximize her success with intensive lifestyle modifications for her multiple health conditions.   Objective:   Blood pressure 135/79, pulse 67, temperature 97.8 F (36.6 C), height 5\' 8"  (1.727 m), weight (!) 358 lb (162.4 kg), last menstrual period 02/09/2020, SpO2 98 %. Body mass index is 54.43 kg/m.  General: Cooperative, alert, well developed, in no acute distress. HEENT: Conjunctivae and lids unremarkable. Cardiovascular: Regular rhythm.  Lungs: Normal work of breathing. Neurologic: No focal  deficits.   Lab Results  Component Value Date   CREATININE 0.89 02/23/2021   BUN 12 02/23/2021   NA 143 02/23/2021   K 4.4 02/23/2021   CL 103 02/23/2021   CO2 22 02/23/2021   Lab Results  Component  Value Date   ALT 31 02/23/2021   AST 20 02/23/2021   ALKPHOS 72 02/23/2021   BILITOT 0.4 02/23/2021   Lab Results  Component Value Date   HGBA1C 5.8 (H) 02/23/2021   HGBA1C 5.4 03/05/2020   Lab Results  Component Value Date   INSULIN 12.7 02/23/2021   Lab Results  Component Value Date   TSH 2.040 02/23/2021   Lab Results  Component Value Date   CHOL 161 02/23/2021   HDL 49 02/23/2021   LDLCALC 99 02/23/2021   TRIG 65 02/23/2021   Lab Results  Component Value Date   WBC 6.3 02/23/2021   HGB 12.5 02/23/2021   HCT 38.4 02/23/2021   MCV 83 02/23/2021   PLT 266 02/23/2021   No results found for: IRON, TIBC, FERRITIN  Attestation Statements:   Reviewed by clinician on day of visit: allergies, medications, problem list, medical history, surgical history, family history, social history, and previous encounter notes.   Wilhemena Durie, am acting as Location manager for [employer], DO.  I have reviewed the above documentation for accuracy and completeness, and I agree with the above. - Current Outpatient Medications  Medication Sig Dispense Refill   COMBIGAN 0.2-0.5 % ophthalmic solution Place 1 drop into both eyes 2 (two) times daily.   6   diphenhydrAMINE (BENADRYL) 25 mg capsule Take 25 mg by mouth every 6 (six) hours as needed for itching, allergies or sleep.     Semaglutide,0.25 or 0.5MG /DOS, (OZEMPIC, 0.25 OR 0.5 MG/DOSE,) 2 MG/1.5ML SOPN Inject 0.5 mg into the skin once a week. 1.5 mL 0   Vitamin D, Ergocalciferol, (DRISDOL) 1.25 MG (50000 UNIT) CAPS capsule Take 1 capsule (50,000 Units total) by mouth every 7 (seven) days. 4 capsule 0   No current facility-administered medications for this visit.

## 2021-05-05 DIAGNOSIS — L03119 Cellulitis of unspecified part of limb: Secondary | ICD-10-CM | POA: Diagnosis not present

## 2021-05-05 DIAGNOSIS — Z6841 Body Mass Index (BMI) 40.0 and over, adult: Secondary | ICD-10-CM | POA: Diagnosis not present

## 2021-05-05 DIAGNOSIS — R600 Localized edema: Secondary | ICD-10-CM | POA: Diagnosis not present

## 2021-05-05 DIAGNOSIS — R739 Hyperglycemia, unspecified: Secondary | ICD-10-CM | POA: Diagnosis not present

## 2021-05-06 ENCOUNTER — Other Ambulatory Visit: Payer: Self-pay | Admitting: Internal Medicine

## 2021-05-06 DIAGNOSIS — R600 Localized edema: Secondary | ICD-10-CM

## 2021-05-11 ENCOUNTER — Encounter (INDEPENDENT_AMBULATORY_CARE_PROVIDER_SITE_OTHER): Payer: Self-pay | Admitting: Bariatrics

## 2021-05-11 ENCOUNTER — Ambulatory Visit (INDEPENDENT_AMBULATORY_CARE_PROVIDER_SITE_OTHER): Payer: BC Managed Care – PPO | Admitting: Bariatrics

## 2021-05-11 ENCOUNTER — Other Ambulatory Visit: Payer: Self-pay

## 2021-05-11 VITALS — BP 146/82 | HR 76 | Temp 97.9°F | Ht 68.0 in | Wt 358.0 lb

## 2021-05-11 DIAGNOSIS — E559 Vitamin D deficiency, unspecified: Secondary | ICD-10-CM

## 2021-05-11 DIAGNOSIS — Z9189 Other specified personal risk factors, not elsewhere classified: Secondary | ICD-10-CM | POA: Diagnosis not present

## 2021-05-11 DIAGNOSIS — R7303 Prediabetes: Secondary | ICD-10-CM | POA: Diagnosis not present

## 2021-05-11 DIAGNOSIS — Z6841 Body Mass Index (BMI) 40.0 and over, adult: Secondary | ICD-10-CM | POA: Diagnosis not present

## 2021-05-11 MED ORDER — OZEMPIC (0.25 OR 0.5 MG/DOSE) 2 MG/1.5ML ~~LOC~~ SOPN: 0.5000 mg | PEN_INJECTOR | SUBCUTANEOUS | 0 refills | Status: DC

## 2021-05-11 MED ORDER — VITAMIN D (ERGOCALCIFEROL) 1.25 MG (50000 UNIT) PO CAPS: 50000.0000 [IU] | ORAL_CAPSULE | ORAL | 0 refills | Status: AC

## 2021-05-12 NOTE — Progress Notes (Signed)
Chief Complaint:   OBESITY Kelly Ward is here to discuss her progress with her obesity treatment plan along with follow-up of her obesity related diagnoses. Kelly Ward is on the Category 3 Plan and states she is following her eating plan approximately 0% of the time. Kelly Ward states she is walking 10 minutes 7 times per week.  Today's visit was #: 5 Starting weight: 360 lbs Starting date: 02/23/2021 Today's weight: 358 lbs Today's date: 05/11/2021 Total lbs lost to date: 2 Total lbs lost since last in-office visit: 0  Interim History: Kelly Ward's weight remains the same. She has had some financial challenges, which limit her food choices.  Subjective:   1. Pre-diabetes Kelly Ward reports polyphagia. She notes Ozempic helps.  2. Vitamin D insufficiency Kelly Ward denies nausea, vomiting, and muscle weakness. She is on prescription Vit D.  3. At risk for constipation Kelly Ward is at increased risk for constipation due to Ozempic.  Assessment/Plan:   1. Pre-diabetes Kelly Ward will continue to work on weight loss, exercise, and decreasing simple carbohydrates to help decrease the risk of diabetes.   - Semaglutide,0.25 or 0.5MG /DOS, (OZEMPIC, 0.25 OR 0.5 MG/DOSE,) 2 MG/1.5ML SOPN; Inject 0.5 mg into the skin once a week.  Dispense: 1.5 mL; Refill: 0  2. Vitamin D insufficiency Low Vitamin D level contributes to fatigue and are associated with obesity, breast, and colon cancer. She agrees to continue to take prescription Vitamin D @50 ,000 IU every week and will follow-up for routine testing of Vitamin D, at least 2-3 times per year to avoid over-replacement.  - Vitamin D, Ergocalciferol, (DRISDOL) 1.25 MG (50000 UNIT) CAPS capsule; Take 1 capsule (50,000 Units total) by mouth every 7 (seven) days.  Dispense: 4 capsule; Refill: 0  3. At risk for constipation Kelly Ward was given approximately 15 minutes of counseling today regarding prevention of constipation. She was encouraged to increase water and  fiber intake.    4. Obesity, current BMI 54.6  Kelly Ward is currently in the action stage of change. As such, her goal is to continue with weight loss efforts. She has agreed to the Category 3 Plan and keeping a food journal and adhering to recommended goals of 1500 calories and 90+ g protein.   Kelly Ward will do more journaling so that she can use foods that are available to her. Meal plan  Exercise goals:  As is  Behavioral modification strategies: increasing lean protein intake, decreasing simple carbohydrates, increasing vegetables, increasing water intake, decreasing eating out, no skipping meals, meal planning and cooking strategies, keeping healthy foods in the home, and planning for success.  Kelly Ward has agreed to follow-up with our clinic in 2 weeks- fasting. She was informed of the importance of frequent follow-up visits to maximize her success with intensive lifestyle modifications for her multiple health conditions.   Objective:   Blood pressure (!) 146/82, pulse 76, temperature 97.9 F (36.6 C), height 5\' 8"  (1.727 m), weight (!) 358 lb (162.4 kg), last menstrual period 02/09/2020, SpO2 98 %. Body mass index is 54.43 kg/m.  General: Cooperative, alert, well developed, in no acute distress. HEENT: Conjunctivae and lids unremarkable. Cardiovascular: Regular rhythm.  Lungs: Normal work of breathing. Neurologic: No focal deficits.   Lab Results  Component Value Date   CREATININE 0.89 02/23/2021   BUN 12 02/23/2021   NA 143 02/23/2021   K 4.4 02/23/2021   CL 103 02/23/2021   CO2 22 02/23/2021   Lab Results  Component Value Date   ALT 31 02/23/2021   AST  20 02/23/2021   ALKPHOS 72 02/23/2021   BILITOT 0.4 02/23/2021   Lab Results  Component Value Date   HGBA1C 5.8 (H) 02/23/2021   HGBA1C 5.4 03/05/2020   Lab Results  Component Value Date   INSULIN 12.7 02/23/2021   Lab Results  Component Value Date   TSH 2.040 02/23/2021   Lab Results  Component Value Date    CHOL 161 02/23/2021   HDL 49 02/23/2021   LDLCALC 99 02/23/2021   TRIG 65 02/23/2021   Lab Results  Component Value Date   WBC 6.3 02/23/2021   HGB 12.5 02/23/2021   HCT 38.4 02/23/2021   MCV 83 02/23/2021   PLT 266 02/23/2021   No results found for: IRON, TIBC, FERRITIN  Attestation Statements:   Reviewed by clinician on day of visit: allergies, medications, problem list, medical history, surgical history, family history, social history, and previous encounter notes.  Coral Ceo, CMA, am acting as Location manager for [employer], DO.  I have reviewed the above documentation for accuracy and completeness, and I agree with the above. Jearld Lesch, DO

## 2021-05-16 ENCOUNTER — Encounter (INDEPENDENT_AMBULATORY_CARE_PROVIDER_SITE_OTHER): Payer: Self-pay | Admitting: Bariatrics

## 2021-05-17 ENCOUNTER — Ambulatory Visit
Admission: RE | Admit: 2021-05-17 | Discharge: 2021-05-17 | Disposition: A | Discharge status: Home / Self Care | Payer: BC Managed Care – PPO | Source: Ambulatory Visit | Attending: Internal Medicine | Admitting: Internal Medicine

## 2021-05-17 DIAGNOSIS — R600 Localized edema: Secondary | ICD-10-CM

## 2021-05-17 DIAGNOSIS — I8392 Asymptomatic varicose veins of left lower extremity: Secondary | ICD-10-CM | POA: Diagnosis not present

## 2021-05-21 ENCOUNTER — Other Ambulatory Visit (INDEPENDENT_AMBULATORY_CARE_PROVIDER_SITE_OTHER): Payer: Self-pay | Admitting: Bariatrics

## 2021-05-21 DIAGNOSIS — E559 Vitamin D deficiency, unspecified: Secondary | ICD-10-CM

## 2021-05-26 DIAGNOSIS — M1712 Unilateral primary osteoarthritis, left knee: Secondary | ICD-10-CM | POA: Diagnosis not present

## 2021-05-26 DIAGNOSIS — M238X2 Other internal derangements of left knee: Secondary | ICD-10-CM | POA: Diagnosis not present

## 2021-05-27 ENCOUNTER — Other Ambulatory Visit: Payer: Self-pay | Admitting: Physician Assistant

## 2021-05-27 DIAGNOSIS — M25562 Pain in left knee: Secondary | ICD-10-CM

## 2021-05-30 ENCOUNTER — Ambulatory Visit
Admission: RE | Admit: 2021-05-30 | Discharge: 2021-05-30 | Disposition: A | Discharge status: Home / Self Care | Payer: BC Managed Care – PPO | Source: Ambulatory Visit | Attending: Physician Assistant | Admitting: Physician Assistant

## 2021-05-30 ENCOUNTER — Other Ambulatory Visit: Payer: BC Managed Care – PPO

## 2021-05-30 DIAGNOSIS — M1712 Unilateral primary osteoarthritis, left knee: Secondary | ICD-10-CM | POA: Diagnosis not present

## 2021-05-30 DIAGNOSIS — I8392 Asymptomatic varicose veins of left lower extremity: Secondary | ICD-10-CM | POA: Diagnosis not present

## 2021-05-30 DIAGNOSIS — M25562 Pain in left knee: Secondary | ICD-10-CM

## 2021-05-30 DIAGNOSIS — R600 Localized edema: Secondary | ICD-10-CM | POA: Diagnosis not present

## 2021-06-07 ENCOUNTER — Ambulatory Visit (INDEPENDENT_AMBULATORY_CARE_PROVIDER_SITE_OTHER): Payer: BC Managed Care – PPO | Admitting: Bariatrics

## 2021-06-08 ENCOUNTER — Ambulatory Visit (INDEPENDENT_AMBULATORY_CARE_PROVIDER_SITE_OTHER): Payer: BC Managed Care – PPO | Admitting: Bariatrics

## 2021-07-05 DIAGNOSIS — H401131 Primary open-angle glaucoma, bilateral, mild stage: Secondary | ICD-10-CM | POA: Diagnosis not present

## 2021-07-05 DIAGNOSIS — H04123 Dry eye syndrome of bilateral lacrimal glands: Secondary | ICD-10-CM | POA: Diagnosis not present

## 2021-07-13 ENCOUNTER — Other Ambulatory Visit (HOSPITAL_BASED_OUTPATIENT_CLINIC_OR_DEPARTMENT_OTHER): Payer: Self-pay

## 2021-07-13 MED ORDER — BRIMONIDINE TARTRATE-TIMOLOL 0.2-0.5 % OP SOLN
1.0000 [drp] | Freq: Two times a day (BID) | OPHTHALMIC | 3 refills | Status: DC
  Filled 2021-07-13 – 2022-05-24 (×2): qty 5, 25d supply, fill #0

## 2021-07-25 ENCOUNTER — Other Ambulatory Visit (INDEPENDENT_AMBULATORY_CARE_PROVIDER_SITE_OTHER): Payer: Self-pay | Admitting: Bariatrics

## 2021-07-25 DIAGNOSIS — R7303 Prediabetes: Secondary | ICD-10-CM

## 2021-07-26 NOTE — Telephone Encounter (Signed)
Last OV with Dr Brown 

## 2021-07-28 ENCOUNTER — Other Ambulatory Visit (HOSPITAL_BASED_OUTPATIENT_CLINIC_OR_DEPARTMENT_OTHER): Payer: Self-pay

## 2021-09-05 ENCOUNTER — Other Ambulatory Visit: Payer: Self-pay | Admitting: Internal Medicine

## 2021-09-05 DIAGNOSIS — Z1231 Encounter for screening mammogram for malignant neoplasm of breast: Secondary | ICD-10-CM

## 2021-10-04 DIAGNOSIS — H401131 Primary open-angle glaucoma, bilateral, mild stage: Secondary | ICD-10-CM | POA: Diagnosis not present

## 2021-10-04 DIAGNOSIS — H04123 Dry eye syndrome of bilateral lacrimal glands: Secondary | ICD-10-CM | POA: Diagnosis not present

## 2021-10-07 ENCOUNTER — Other Ambulatory Visit: Payer: Self-pay

## 2021-10-07 ENCOUNTER — Ambulatory Visit
Admission: RE | Admit: 2021-10-07 | Discharge: 2021-10-07 | Disposition: A | Discharge status: Home / Self Care | Payer: BC Managed Care – PPO | Source: Ambulatory Visit | Attending: Internal Medicine | Admitting: Internal Medicine

## 2021-10-07 DIAGNOSIS — Z1231 Encounter for screening mammogram for malignant neoplasm of breast: Secondary | ICD-10-CM | POA: Diagnosis not present

## 2021-10-10 DIAGNOSIS — E559 Vitamin D deficiency, unspecified: Secondary | ICD-10-CM | POA: Diagnosis not present

## 2021-10-10 DIAGNOSIS — Z01411 Encounter for gynecological examination (general) (routine) with abnormal findings: Secondary | ICD-10-CM | POA: Diagnosis not present

## 2021-10-10 DIAGNOSIS — M79643 Pain in unspecified hand: Secondary | ICD-10-CM | POA: Diagnosis not present

## 2021-10-10 DIAGNOSIS — Z6841 Body Mass Index (BMI) 40.0 and over, adult: Secondary | ICD-10-CM | POA: Diagnosis not present

## 2021-10-10 DIAGNOSIS — Z0001 Encounter for general adult medical examination with abnormal findings: Secondary | ICD-10-CM | POA: Diagnosis not present

## 2021-10-10 DIAGNOSIS — R7309 Other abnormal glucose: Secondary | ICD-10-CM | POA: Diagnosis not present

## 2021-11-07 DIAGNOSIS — U071 COVID-19: Secondary | ICD-10-CM | POA: Diagnosis not present

## 2021-11-07 DIAGNOSIS — R051 Acute cough: Secondary | ICD-10-CM | POA: Diagnosis not present

## 2022-01-02 ENCOUNTER — Other Ambulatory Visit (HOSPITAL_COMMUNITY): Payer: Self-pay

## 2022-01-02 MED ORDER — WEGOVY 0.5 MG/0.5ML ~~LOC~~ SOAJ
0.5000 mg | SUBCUTANEOUS | 1 refills | Status: DC
  Filled 2022-01-02 – 2022-01-04 (×2): qty 2, 28d supply, fill #0

## 2022-01-04 ENCOUNTER — Other Ambulatory Visit (HOSPITAL_COMMUNITY): Payer: Self-pay

## 2022-01-11 DIAGNOSIS — H04123 Dry eye syndrome of bilateral lacrimal glands: Secondary | ICD-10-CM | POA: Diagnosis not present

## 2022-01-11 DIAGNOSIS — H401131 Primary open-angle glaucoma, bilateral, mild stage: Secondary | ICD-10-CM | POA: Diagnosis not present

## 2022-02-01 IMAGING — MG DIGITAL SCREENING BILAT W/ CAD
8 of 10 series · 8 of 10 positions shown · non-contrast
Comparison: Previous exam(s).

CLINICAL DATA: Screening.

EXAM:
DIGITAL SCREENING BILATERAL MAMMOGRAM WITH CAD

[R MLO (1 of 2)]
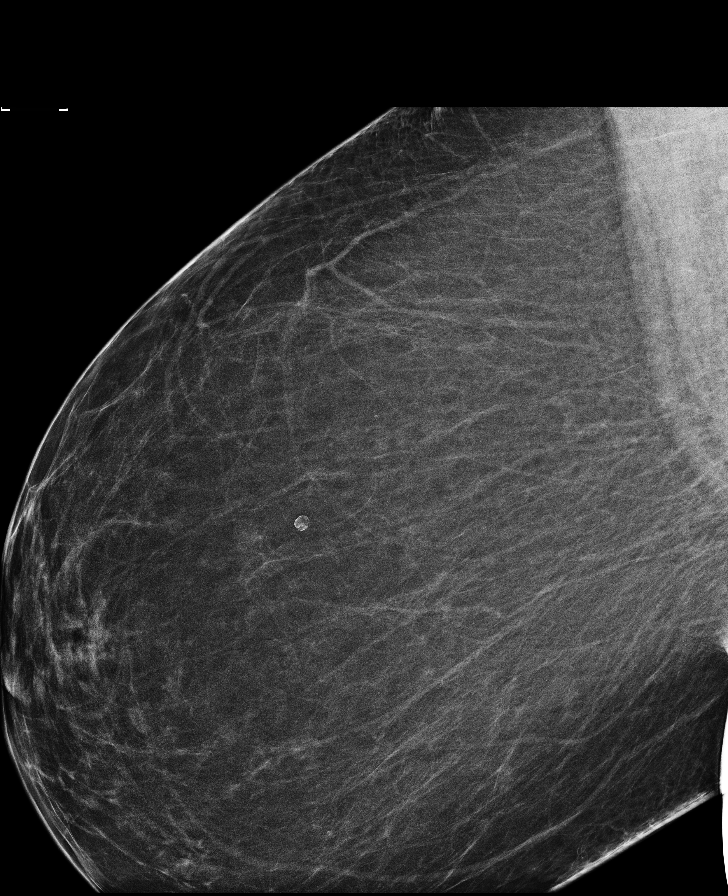

[R CC (1 of 2)]
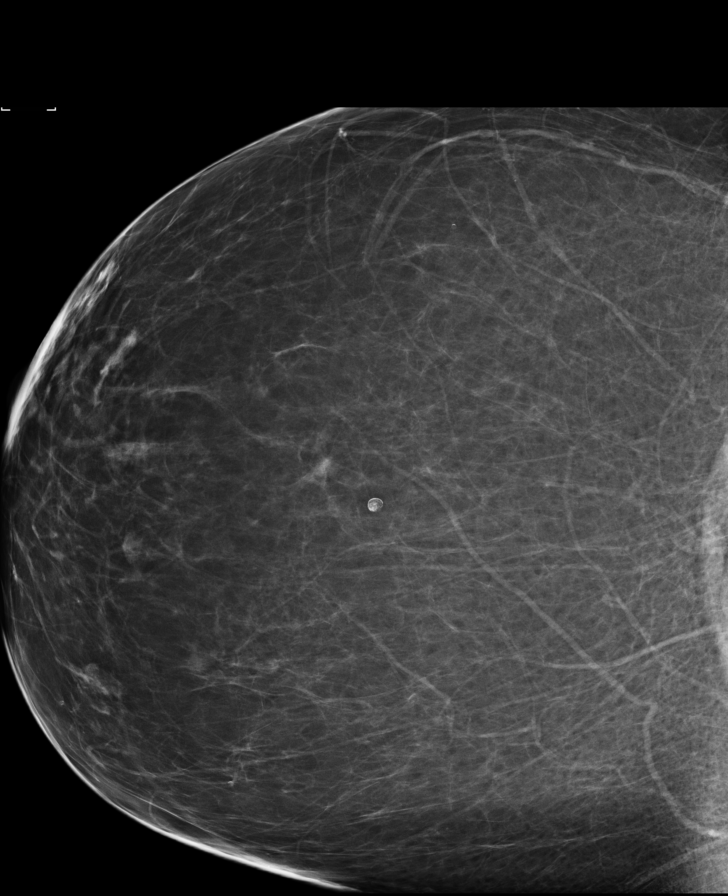

[L CC (1 of 2)]
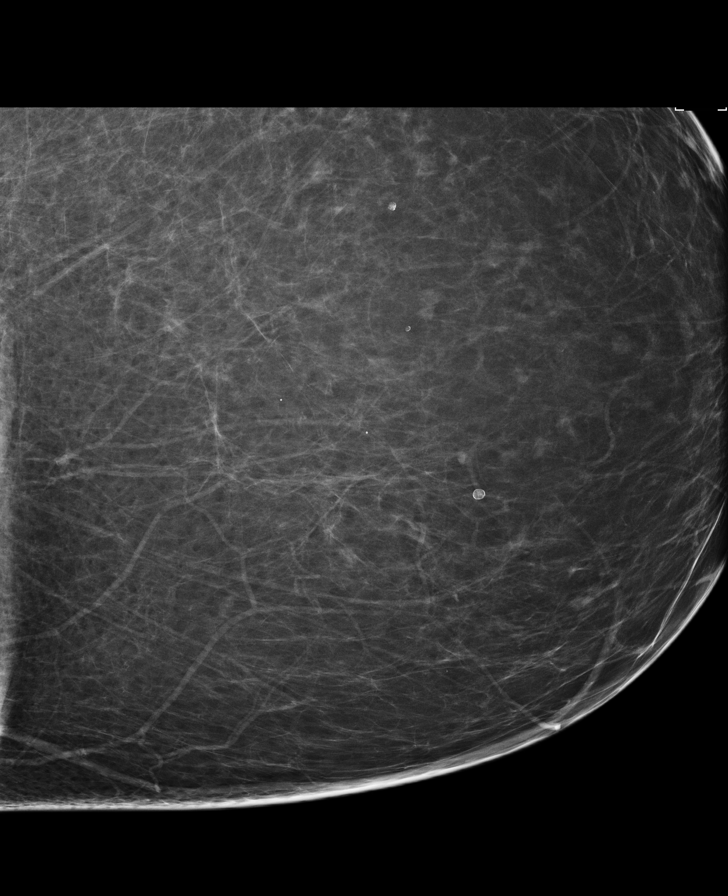

[L MLO (1 of 2)]
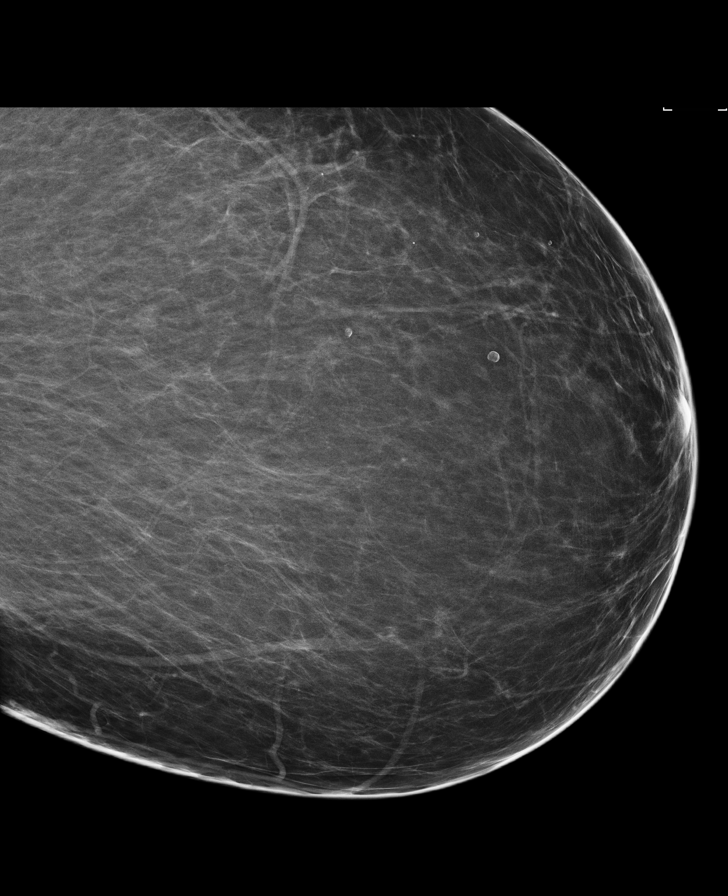

[L CC (2 of 2)]
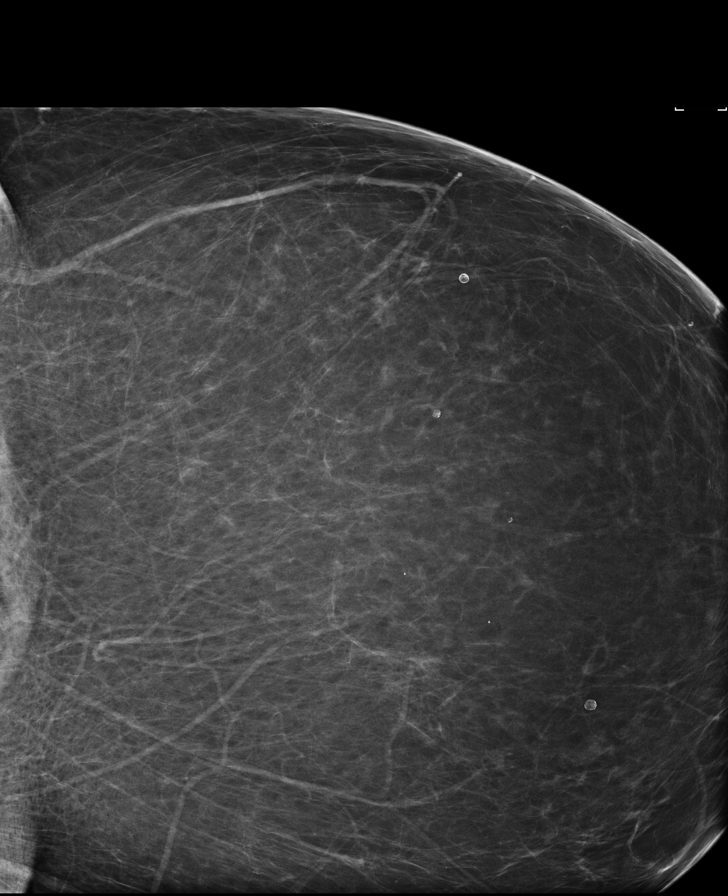

[L MLO (2 of 2)]
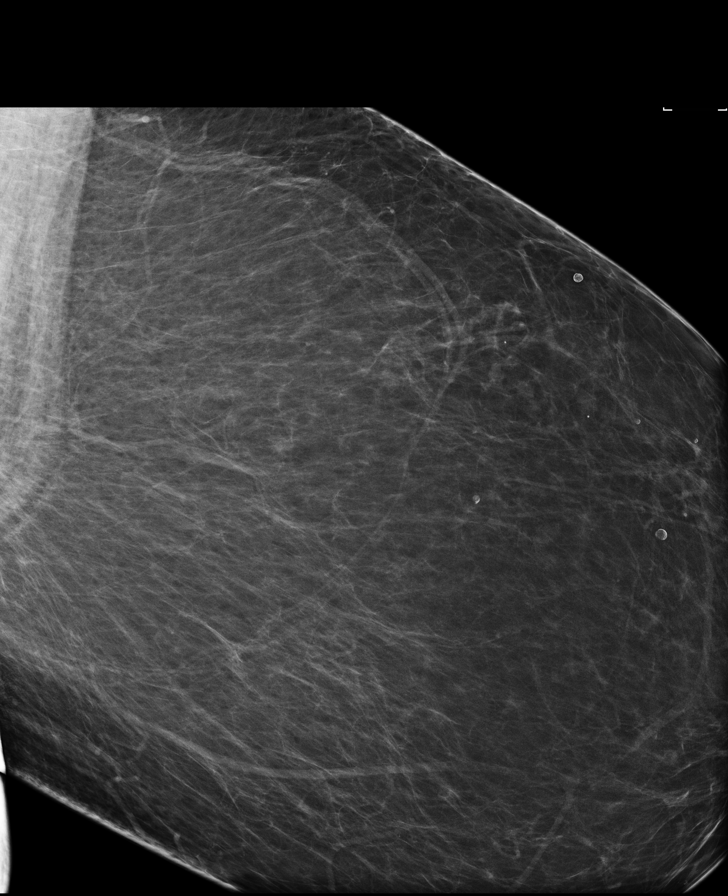

[R CC (2 of 2)]
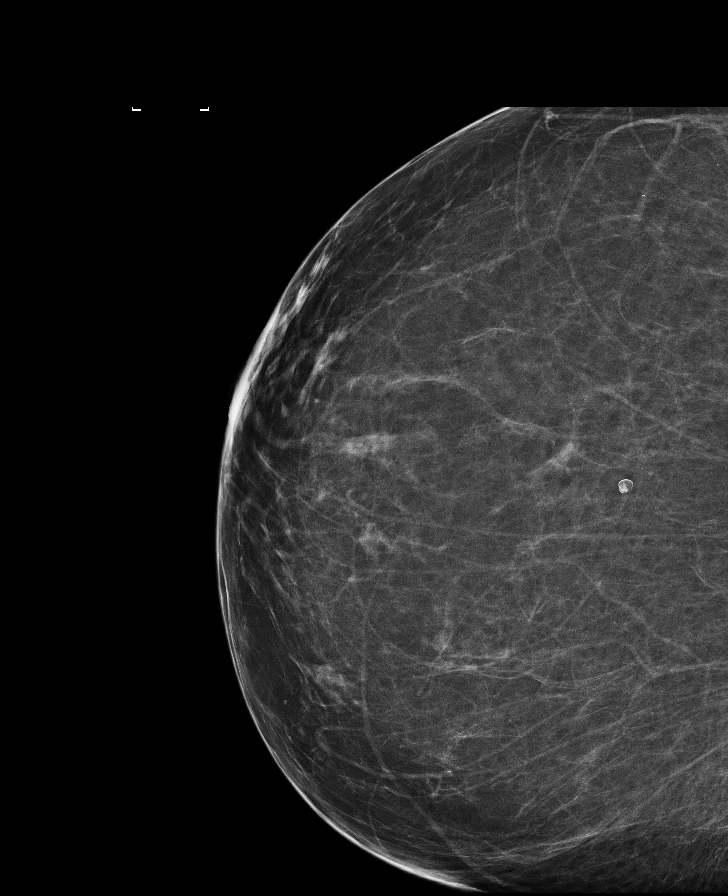

[R MLO (2 of 2)]
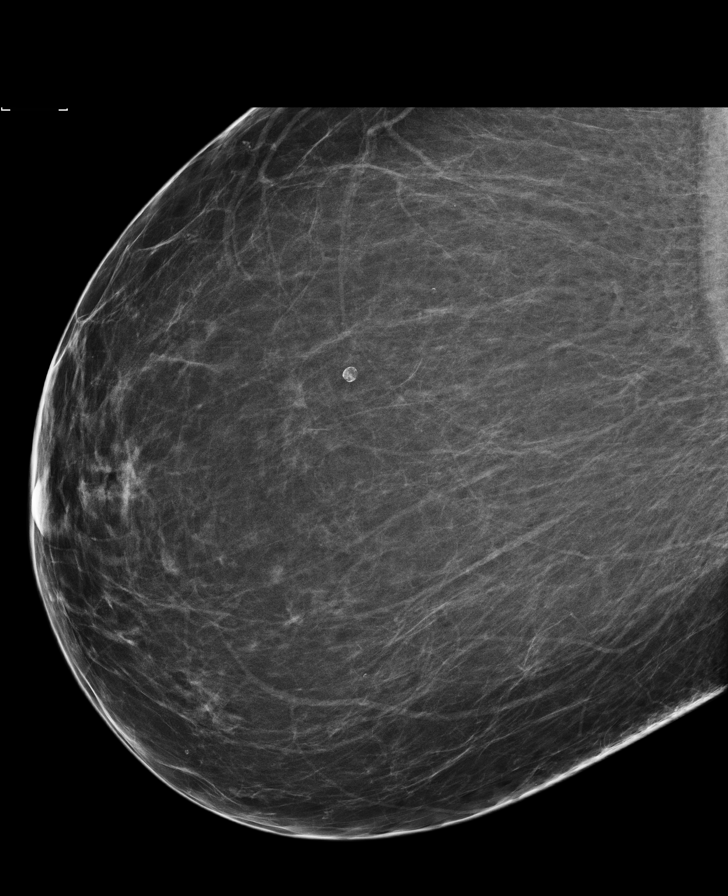

[8 of 10 positions shown; findings below may reference images not displayed]

ACR Breast Density Category b: There are scattered areas of
fibroglandular density.
FINDINGS: There are no findings suspicious for malignancy. Images were
processed with CAD.
IMPRESSION: No mammographic evidence of malignancy. A result letter of this
screening mammogram will be mailed directly to the patient.

RECOMMENDATION:
Screening mammogram in one year. (Code:AS-G-LCT)

BI-RADS CATEGORY  1: Negative.

## 2022-02-22 ENCOUNTER — Other Ambulatory Visit (HOSPITAL_COMMUNITY): Payer: Self-pay

## 2022-02-22 MED ORDER — WEGOVY 0.5 MG/0.5ML ~~LOC~~ SOAJ
0.5000 mg | SUBCUTANEOUS | 3 refills | Status: DC
  Filled 2022-02-22 (×2): qty 2, 28d supply, fill #0

## 2022-04-10 ENCOUNTER — Other Ambulatory Visit (HOSPITAL_COMMUNITY): Payer: Self-pay

## 2022-04-10 DIAGNOSIS — Z6841 Body Mass Index (BMI) 40.0 and over, adult: Secondary | ICD-10-CM | POA: Diagnosis not present

## 2022-04-10 DIAGNOSIS — M79671 Pain in right foot: Secondary | ICD-10-CM | POA: Diagnosis not present

## 2022-04-10 DIAGNOSIS — M19049 Primary osteoarthritis, unspecified hand: Secondary | ICD-10-CM | POA: Diagnosis not present

## 2022-04-10 MED ORDER — WEGOVY 1 MG/0.5ML ~~LOC~~ SOAJ
1.0000 mg | SUBCUTANEOUS | 3 refills | Status: DC
  Filled 2022-04-10 – 2022-04-24 (×2): qty 2, 28d supply, fill #0
  Filled 2022-05-08 – 2022-05-17 (×2): qty 2, 28d supply, fill #1
  Filled 2022-06-20: qty 2, 28d supply, fill #0
  Filled 2022-06-20: qty 2, 28d supply, fill #2
  Filled 2022-07-14 – 2022-07-17 (×2): qty 2, 28d supply, fill #1

## 2022-04-13 DIAGNOSIS — H04123 Dry eye syndrome of bilateral lacrimal glands: Secondary | ICD-10-CM | POA: Diagnosis not present

## 2022-04-13 DIAGNOSIS — H401131 Primary open-angle glaucoma, bilateral, mild stage: Secondary | ICD-10-CM | POA: Diagnosis not present

## 2022-04-24 ENCOUNTER — Other Ambulatory Visit (HOSPITAL_COMMUNITY): Payer: Self-pay

## 2022-05-08 ENCOUNTER — Ambulatory Visit (INDEPENDENT_AMBULATORY_CARE_PROVIDER_SITE_OTHER): Payer: BC Managed Care – PPO

## 2022-05-08 ENCOUNTER — Other Ambulatory Visit (HOSPITAL_COMMUNITY): Payer: Self-pay

## 2022-05-08 ENCOUNTER — Ambulatory Visit: Payer: BC Managed Care – PPO

## 2022-05-08 ENCOUNTER — Ambulatory Visit: Payer: BC Managed Care – PPO | Admitting: Podiatry

## 2022-05-08 DIAGNOSIS — M7671 Peroneal tendinitis, right leg: Secondary | ICD-10-CM

## 2022-05-08 DIAGNOSIS — M7750 Other enthesopathy of unspecified foot: Secondary | ICD-10-CM

## 2022-05-08 DIAGNOSIS — M7751 Other enthesopathy of right foot: Secondary | ICD-10-CM | POA: Diagnosis not present

## 2022-05-08 DIAGNOSIS — M2141 Flat foot [pes planus] (acquired), right foot: Secondary | ICD-10-CM

## 2022-05-08 DIAGNOSIS — M2142 Flat foot [pes planus] (acquired), left foot: Secondary | ICD-10-CM | POA: Diagnosis not present

## 2022-05-08 MED ORDER — MELOXICAM 15 MG PO TABS
15.0000 mg | ORAL_TABLET | Freq: Every day | ORAL | 3 refills | Status: DC
  Filled 2022-05-08: qty 30, 30d supply, fill #0

## 2022-05-08 NOTE — Progress Notes (Signed)
  Subjective:  Patient ID: Kelly Ward, female    DOB: 1966-07-21,  MRN: [id number]  Chief Complaint  Patient presents with   Callouses        NP- R foot had a knot that hurts.    56 y.o. female presents with the above complaint. History confirmed with patient.  She fell about a year ago and injured her left leg and the right foot pain developed slowly after that.  Its been going on for few months now.  She wonders if orthotics would support the foot and improve this  Objective:  Physical Exam: warm, good capillary refill, no trophic changes or ulcerative lesions, normal DP and PT pulses, and normal sensory exam.  Right Foot: Tenderness on palpation to the insertion of the peroneus brevis at the fifth metatarsal base, no ecchymosis or evidence of fracture, 4+ out of 5 strength in eversion  No images are attached to the encounter.  Radiographs: Multiple views x-ray of the right foot: no fracture, dislocation, swelling or degenerative changes noted, there is some periosteal reaction at the insertion of the fifth met base Assessment:   1. Peroneal tendinitis of right lower extremity   2. Pes planus of both feet      Plan:  Patient was evaluated and treated and all questions answered.  Discussed the etiology and treatment options for peroneal tendinitis including stretching, formal physical therapy with an eccentric exercises therapy plan, supportive shoegears such as a running shoe or sneaker, bracing and orthotics, topical and oral medications.  We also discussed that I do not routinely perform injections in this area because of the risk of an increased damage or rupture of the tendon.  We also discussed the role of surgical treatment of this for patients who do not improve after exhausting non-surgical treatment options.  -XR reviewed with patient -Educated on stretching and icing of the affected limb. -Referral placed to physical therapy. -Rx for Meloxicam. Advised on  risks, benefits, and alternatives of the medication -Tri-Lock ankle brace dispensed. I do think with her peroneal tendinitis and pes planus deformity she would benefit greatly from custom molded arch supports and foot orthoses.  She was seen by the orthotist and fitted for these today as well.  Return in about 6 weeks (around 06/19/2022) for re-check peroneal tendinitis.

## 2022-05-08 NOTE — Progress Notes (Signed)
SITUATION Reason for Consult: Evaluation for Bilateral Custom Foot Orthoses Patient / Caregiver Report: Patient is ready for foot orthotics  OBJECTIVE DATA: Patient History / Diagnosis:    ICD-10-CM   1. Peroneal tendinitis of right lower extremity  M76.71       Current or Previous Devices:   None and no history  Foot Examination: Skin presentation:   Intact Ulcers & Callousing:   None Toe / Foot Deformities:  Pes planus Weight Bearing Presentation:  Planus Sensation:    Intact  Shoe Size:    11  ORTHOTIC RECOMMENDATION Recommended Device: 1x pair of custom functional foot orthotics  GOALS OF ORTHOSES - Reduce Pain - Prevent Foot Deformity - Prevent Progression of Further Foot Deformity - Relieve Pressure - Improve the Overall Biomechanical Function of the Foot and Lower Extremity.  ACTIONS PERFORMED Potential out of pocket cost was communicated to patient. Patient understood and consent to casting. Patient was casted for Foot Orthoses via crush box. Procedure was explained and patient tolerated procedure well. Casts were shipped to central fabrication. All questions were answered and concerns addressed.  PLAN Patient is to be called for fitting when devices are ready.

## 2022-05-08 NOTE — Patient Instructions (Addendum)
Look for Voltaren gel at the pharmacy over the counter or online (also known as diclofenac 1% gel). Apply to the painful areas 3-4x daily with the supplied dosing card. Allow to dry for 10 minutes before going into socks/shoes  Call to schedule physical therapy: [facility] [place]  [phone]   Peroneal Tendinopathy Rehab Ask your health care provider which exercises are safe for you. Do exercises exactly as told by your health care provider and adjust them as directed. It is normal to feel mild stretching, pulling, tightness, or discomfort as you do these exercises. Stop right away if you feel sudden pain or your pain gets worse. Do not begin these exercises until told by your health care provider. Stretching and range-of-motion exercises These exercises warm up your muscles and joints and improve the movement and flexibility of your ankle. These exercises also help to relieve pain and stiffness. Gastroc and soleus stretch, standing  This is an exercise in which you stand on a step and use your body weight to stretch your calf muscles. To do this exercise: Stand on the edge of a step on the ball of your left / right foot. The ball of your foot is on the walking surface, right under your toes. Keep your other foot firmly on the same step. Hold on to the wall, a railing, or a chair for balance. Slowly lift your other foot, allowing your body weight to press your left / right heel down over the edge of the step. You should feel a stretch in your left / right calf (gastrocnemius and soleus). Hold this position for 15 seconds. Return both feet to the step. Repeat this exercise with a slight bend in your left / right knee. Repeat 5 times with your left / right knee straight and 5 times with your left / right knee bent. Complete this exercise 2 times a day. Strengthening exercises These exercises build strength and endurance  in your foot and ankle. Endurance is the ability to use your muscles for a long time, even after they get tired. Ankle dorsiflexion with band   Secure a rubber exercise band or tube to an object, such as a table leg, that will not move when the band is pulled. Secure the other end of the band around your left / right foot. Sit on the floor, facing the object with your left / right leg extended. The band or tube should be slightly tense when your foot is relaxed. Slowly flex your left / right ankle and toes to bring your foot toward you (dorsiflexion). Hold this position for 15 seconds. Let the band or tube slowly pull your foot back to the starting position. Repeat 5 times. Complete this exercise 2 times a day. Ankle eversion Sit on the floor with your legs straight out in front of you. Loop a rubber exercise band or tube around the ball of your left / right foot. The ball of your foot is on the walking surface, right under your toes. Hold the ends of the band in your hands, or secure the band to a stable object. The band or tube should be slightly tense when your foot is relaxed. Slowly push your foot outward, away from your other leg (eversion). Hold this position for 15 seconds. Slowly return your foot to the starting position. Repeat 5 times. Complete this exercise 2 times a day. Plantar flexion, standing  This exercise is sometimes  called standing heel raise. Stand with your feet shoulder-width apart. Place your hands on a wall or table to steady yourself as needed, but try not to use it for support. Keep your weight spread evenly over the width of your feet while you slowly rise up on your toes (plantar flexion). If told by your health care provider: Shift your weight toward your left / right leg until you feel challenged. Stand on your left / right leg only. Hold this position for 15 seconds. Repeat 2 times. Complete this exercise 2 times a day. Single leg stand Without shoes,  stand near a railing or in a doorway. You may hold on to the railing or door frame as needed. Stand on your left / right foot. Keep your big toe down on the floor and try to keep your arch lifted. Do not roll to the outside of your foot. If this exercise is too easy, you can try it with your eyes closed or while standing on a pillow. Hold this position for 15 seconds. Repeat 5 times. Complete this exercise 2 times a day. This information is not intended to replace advice given to you by your health care provider. Make sure you discuss any questions you have with your health care provider. Document Revised: 02/25/2019 Document Reviewed: 02/25/2019 Elsevier Patient Education  [place].

## 2022-05-17 ENCOUNTER — Other Ambulatory Visit (HOSPITAL_COMMUNITY): Payer: Self-pay

## 2022-05-18 ENCOUNTER — Other Ambulatory Visit (HOSPITAL_COMMUNITY): Payer: Self-pay

## 2022-05-19 ENCOUNTER — Other Ambulatory Visit (HOSPITAL_COMMUNITY): Payer: Self-pay

## 2022-05-19 NOTE — Therapy (Signed)
OUTPATIENT PHYSICAL THERAPY LOWER EXTREMITY EVALUATION   Patient Name: Kelly Ward MRN: [id number] DOB:August 28, 1966, 56 y.o., female Today's Date: 05/19/2022    Past Medical History:  Diagnosis Date   Allergies    Anemia    Fibromyalgia    Food allergy    Gallbladder problem    Glaucoma    both eyes   Pre-diabetes    Severe obesity (BMI >= 40) ([place]) 05/29/2016   bmi 80   Vitamin B 12 deficiency    Vitamin D deficiency    Past Surgical History:  Procedure Laterality Date   CHOLECYSTECTOMY N/A 05/28/2016   Procedure: LAPAROSCOPIC CHOLECYSTECTOMY ;  Surgeon: Rolm Bookbinder, MD;  Location: [place];  Service: General;  Laterality: N/A;   COLONOSCOPY WITH PROPOFOL N/A 12/16/2015   Procedure: COLONOSCOPY WITH PROPOFOL;  Surgeon: Juanita Craver, MD;  Location: [facility];  Service: Endoscopy;  Laterality: N/A;   HYSTERECTOMY ABDOMINAL WITH SALPINGECTOMY N/A 03/12/2020   Procedure: HYSTERECTOMY ABDOMINAL WITH BILATERAL SALPINGECTOMY;  Surgeon: Servando Salina, MD;  Location: [place];  Service: Gynecology;  Laterality: N/A;   NO PAST SURGERIES     Patient Active Problem List   Diagnosis Date Noted   Abnormal perimenopausal bleeding 03/14/2020   Uterine fibroid 03/12/2020   Status post total hysterectomy 03/12/2020   Severe obesity (BMI >= 40) ([place]) 05/29/2016    PCP: Willey Blade, MD  REFERRING PROVIDER: Criselda Peaches, DPM  REFERRING DIAG: Peroneal tendinitis of right lower extremity  THERAPY DIAG:  No diagnosis found.  Rationale for Evaluation and Treatment Rehabilitation  ONSET DATE: ***   SUBJECTIVE:  SUBJECTIVE STATEMENT: ***  PERTINENT HISTORY: ***  PAIN:  Are you having pain? Yes:  NPRS scale: ***/10 Pain location: Right ankle Pain description: *** Aggravating factors: *** Relieving factors: ***  PRECAUTIONS: None  WEIGHT BEARING RESTRICTIONS No  FALLS:  Has patient fallen in last 6 months? {fallsyesno:27318}  LIVING  ENVIRONMENT: Lives with: {[facility] lives with:25569::"lives with their family"} Lives in: {Lives in:25570} Stairs: {opstairs:27293} Has following equipment at home: {Assistive devices:23999}  OCCUPATION: ***  PLOF: {PLOF:24004}  PATIENT GOALS: Pain relief and ***   OBJECTIVE:  PATIENT SURVEYS:  FOTO ***  COGNITION: Overall cognitive status: Within functional limits for tasks assessed     SENSATION: {sensation:27233}  EDEMA:  {edema:24020}  MUSCLE LENGTH: ***  POSTURE:   ***  PALPATION: ***  LOWER EXTREMITY ROM:  Active ROM Right eval Left eval  Ankle dorsiflexion    Ankle plantarflexion    Ankle inversion    Ankle eversion     LOWER EXTREMITY MMT:  MMT Right eval Left eval  Hip flexion    Hip extension    Hip abduction    Knee flexion    Knee extension    Ankle dorsiflexion    Ankle plantarflexion    Ankle inversion    Ankle eversion     FUNCTIONAL TESTS:  {Functional tests:24029}  GAIT: Distance walked: *** Assistive device utilized: {Assistive devices:23999} Level of assistance: {Levels of assistance:24026} Comments: ***   TODAY'S TREATMENT: ***   PATIENT EDUCATION:  Education details: Exam findings, POC, HEP Person educated: Patient Education method: Explanation Education comprehension: verbalized understanding, returned demonstration, verbal cues required, tactile cues required, and needs further education  HOME EXERCISE PROGRAM: ***   ASSESSMENT: CLINICAL IMPRESSION: Patient is a 56 y.o. female who was seen today for physical therapy evaluation and treatment for chronic right ankle pain.    OBJECTIVE IMPAIRMENTS {opptimpairments:25111}.   ACTIVITY LIMITATIONS {activitylimitations:27494}  PARTICIPATION  LIMITATIONS: {participationrestrictions:25113}  PERSONAL FACTORS {Personal factors:25162} are also affecting patient's functional outcome.   REHAB POTENTIAL: {rehabpotential:25112}  CLINICAL DECISION MAKING: {clinical  decision making:25114}  EVALUATION COMPLEXITY: {Evaluation complexity:25115}   GOALS: Goals reviewed with patient? Yes  SHORT TERM GOALS: Target date: {follow up:25551}   Patient will be I with initial HEP in order to progress with therapy. Baseline: HEP provided at eval Goal status: INITIAL  2.  PT will review FOTO with patient by 3rd visit in order to understand expected progress and outcome with therapy. Baseline: FOTO assessed at eval Goal status: INITIAL  3.  *** Baseline:  Goal status: INITIAL  LONG TERM GOALS: Target date: {follow up:25551}   Patient will be I with final HEP to maintain progress from PT. Baseline: HEP provided at eval Goal status: INITIAL  2.  Patient will report >/= ***% status on FOTO to indicate improved functional ability. Baseline: ***% Goal status: INITIAL  3.  *** Baseline:  Goal status: INITIAL  4.  *** Baseline:  Goal status: INITIAL   PLAN: PT FREQUENCY: {rehab frequency:25116}  PT DURATION: {rehab duration:25117}  PLANNED INTERVENTIONS: {rehab planned interventions:25118::"Therapeutic exercises","Therapeutic activity","Neuromuscular re-education","Balance training","Gait training","Patient/Family education","Joint mobilization"}  PLAN FOR NEXT SESSION: Review HEP and progress PRN, ***   Hilda Blades, PT, DPT, LAT, ATC 05/19/22  8:17 AM Phone: [phone] Fax: [phone]

## 2022-05-22 ENCOUNTER — Ambulatory Visit: Payer: BC Managed Care – PPO | Attending: Podiatry | Admitting: Physical Therapy

## 2022-05-22 ENCOUNTER — Other Ambulatory Visit: Payer: Self-pay

## 2022-05-22 ENCOUNTER — Encounter: Payer: Self-pay | Admitting: Physical Therapy

## 2022-05-22 DIAGNOSIS — M6281 Muscle weakness (generalized): Secondary | ICD-10-CM | POA: Diagnosis not present

## 2022-05-22 DIAGNOSIS — R2689 Other abnormalities of gait and mobility: Secondary | ICD-10-CM | POA: Insufficient documentation

## 2022-05-22 DIAGNOSIS — M7671 Peroneal tendinitis, right leg: Secondary | ICD-10-CM | POA: Insufficient documentation

## 2022-05-22 DIAGNOSIS — M25571 Pain in right ankle and joints of right foot: Secondary | ICD-10-CM | POA: Insufficient documentation

## 2022-05-22 NOTE — Patient Instructions (Signed)
Access Code: [email or web address] URL: [email or web address] Date: 05/22/2022 Prepared by: Hilda Blades  Exercises - Long Sitting Ankle Plantar Flexion with Resistance  - 1-2 x daily - 7 x weekly - 2 sets - 20 reps - Seated Ankle Eversion with Resistance  - 1-2 x daily - 7 x weekly - 2 sets - 15 reps - Seated Ankle Inversion with Resistance  - 1-2 x daily - 7 x weekly - 2 sets - 15 reps - Seated Ankle Dorsiflexion with Resistance  - 1-2 x daily - 7 x weekly - 2 sets - 20 reps - Seated Heel Toe Raises  - 1-2 x daily - 7 x weekly - 2 sets - 20 reps - Long Sitting Calf Stretch with Strap  - 1-2 x daily - 7 x weekly - 3 reps - 30 seconds hold

## 2022-05-24 ENCOUNTER — Other Ambulatory Visit (HOSPITAL_COMMUNITY): Payer: Self-pay

## 2022-06-01 ENCOUNTER — Ambulatory Visit: Payer: BC Managed Care – PPO

## 2022-06-01 DIAGNOSIS — M25571 Pain in right ankle and joints of right foot: Secondary | ICD-10-CM

## 2022-06-01 DIAGNOSIS — M7671 Peroneal tendinitis, right leg: Secondary | ICD-10-CM | POA: Diagnosis not present

## 2022-06-01 DIAGNOSIS — M6281 Muscle weakness (generalized): Secondary | ICD-10-CM

## 2022-06-01 DIAGNOSIS — R2689 Other abnormalities of gait and mobility: Secondary | ICD-10-CM

## 2022-06-01 NOTE — Therapy (Signed)
OUTPATIENT PHYSICAL THERAPY TREATMENT NOTE   Patient Name: Kelly Ward MRN: [id number] DOB:March 14, 1966, 56 y.o., female Today's Date: 06/01/2022  PCP: Willey Blade, MD REFERRING PROVIDER: Criselda Peaches, DPM  END OF SESSION:   PT End of Session - 06/01/22 1645     Visit Number 2    Number of Visits 8    Date for PT Re-Evaluation 07/17/22    Authorization Type BCBS    PT Start Time 1650    PT Stop Time 1730    PT Time Calculation (min) 40 min             Past Medical History:  Diagnosis Date   Allergies    Anemia    Fibromyalgia    Food allergy    Gallbladder problem    Glaucoma    both eyes   Pre-diabetes    Severe obesity (BMI >= 40) ([place]) 05/29/2016   bmi 22   Vitamin B 12 deficiency    Vitamin D deficiency    Past Surgical History:  Procedure Laterality Date   CHOLECYSTECTOMY N/A 05/28/2016   Procedure: LAPAROSCOPIC CHOLECYSTECTOMY ;  Surgeon: Rolm Bookbinder, MD;  Location: [place];  Service: General;  Laterality: N/A;   COLONOSCOPY WITH PROPOFOL N/A 12/16/2015   Procedure: COLONOSCOPY WITH PROPOFOL;  Surgeon: Juanita Craver, MD;  [facility];  Service: Endoscopy;  Laterality: N/A;   HYSTERECTOMY ABDOMINAL WITH SALPINGECTOMY N/A 03/12/2020   Procedure: HYSTERECTOMY ABDOMINAL WITH BILATERAL SALPINGECTOMY;  Surgeon: Servando Salina, MD;  Location: [place];  Service: Gynecology;  Laterality: N/A;   NO PAST SURGERIES     Patient Active Problem List   Diagnosis Date Noted   Abnormal perimenopausal bleeding 03/14/2020   Uterine fibroid 03/12/2020   Status post total hysterectomy 03/12/2020   Severe obesity (BMI >= 40) ([place]) 05/29/2016    REFERRING DIAG: Peroneal tendinitis of right lower extremity  THERAPY DIAG:  Pain in right ankle and joints of right foot  Muscle weakness (generalized)  Other abnormalities of gait and mobility  Rationale for Evaluation and Treatment Rehabilitation  PERTINENT HISTORY: None  PRECAUTIONS:  None  SUBJECTIVE: Patient reports HEP compliance, continued ankle pain radiating up distal LE.  PAIN:  Are you having pain? Yes:  NPRS scale: 7/10 (can be 10/10 with walking) Pain location: Right ankle / foot, lateral Pain description: Constant, sharp, throbbing Aggravating factors: Walking, standing Relieving factors: Medication, ankle brace   OBJECTIVE: (objective measures completed at initial evaluation unless otherwise dated)   PATIENT SURVEYS:  FOTO 67% functional status   COGNITION: Overall cognitive status: Within functional limits for tasks assessed                          SENSATION: WFL   EDEMA:  Not assessed   MUSCLE LENGTH: Calf flexibility deficit   POSTURE:  Patient exhibits pes planus, she rests with ankle in inversion while seated   PALPATION: Tender to palpation base of 5th met and peroneal muscle belly with palpable trigger points   LOWER EXTREMITY ROM:   Active ROM Right eval Left eval  Ankle dorsiflexion 0 -  Ankle plantarflexion 40 -  Ankle inversion 45 -  Ankle eversion 15 -    LOWER EXTREMITY MMT:   MMT Right eval Left eval  Hip flexion 4 4  Hip extension 3 3  Hip abduction 3 3  Knee flexion 5 5  Knee extension 5 5  Ankle dorsiflexion 4 -  Ankle plantarflexion  3- -  Ankle inversion 4- -  Ankle eversion 4- -    FUNCTIONAL TESTS:  SLS: unable to tolerate on right   GAIT: Assistive device utilized: None Level of assistance: Complete Independence Comments: Antalgic on right with toe out, bilateral pes planus with patient wearing sandals without arch support and ankle brace on right     TODAY'S TREATMENT: [facility] Adult PT Treatment:                                                DATE: 06/01/2022 Therapeutic Exercise: Nustep level 5 x 5 mins while gathering subjective Slant board calf stretch x1' 4 way ankle RTB x10 each BAPS PF/DF x15 BAPS circles CW/CCW x10 each  Ankle inversion/eversion towel slides 6#  Towel scrunches  2x1' Toe yoga 2x1' (difficult) DF back against wall 2x15 PF facing wall 2x15   05/22/22 Longsitting ankle PF with red x 20 Seated ankle eversion, inversion, DF with red x 15 each Seated heel toe raises Longsitting calf stretch 2 x 30 sec     PATIENT EDUCATION:  Education details: Exam findings, POC, HEP Person educated: Patient Education method: Explanation Education comprehension: verbalized understanding, returned demonstration, verbal cues required, tactile cues required, and needs further education   HOME EXERCISE PROGRAM: Access Code: [email or web address]     ASSESSMENT: CLINICAL IMPRESSION: Patient presents to PT with continued high levels of ankle and distal LE pain and reports continued difficulty walking and states has been compliant with her HEP. Session today focused on strengthening for Rt ankle and foot as well as stretching for gastroc. She had difficulty with toe yoga, particularly with isolating movements of the great toe. Patient was able to tolerate all prescribed exercises with no adverse effects. Patient continues to benefit from skilled PT services and should be progressed as able to improve functional independence.      OBJECTIVE IMPAIRMENTS Abnormal gait, decreased activity tolerance, decreased balance, decreased ROM, decreased strength, impaired flexibility, postural dysfunction, and pain.    ACTIVITY LIMITATIONS standing and locomotion level   PARTICIPATION LIMITATIONS: meal prep, cleaning, shopping, community activity, and occupation   [place], Past/current experiences, Time since onset of injury/illness/exacerbation, and 1 comorbidity: BMI  are also affecting patient's functional outcome.    REHAB POTENTIAL: Good   CLINICAL DECISION MAKING: Stable/uncomplicated   EVALUATION COMPLEXITY: Low     GOALS: Goals reviewed with patient? Yes   SHORT TERM GOALS: Target date: 06/19/2022    Patient will be I with initial HEP in order to progress with  therapy. Baseline: HEP provided at eval Goal status: INITIAL   2.  PT will review FOTO with patient by 3rd visit in order to understand expected progress and outcome with therapy. Baseline: FOTO assessed at eval Goal status: INITIAL   3.  Patient will report pain with walking and standing </= 6/10 in order to reduce functional limitations Baseline: pain up to 10/10 Goal status: INITIAL   LONG TERM GOALS: Target date: 07/17/2022    Patient will be I with final HEP to maintain progress from PT. Baseline: HEP provided at eval Goal status: INITIAL   2.  Patient will report >/= 73% status on FOTO to indicate improved functional ability. Baseline: 67% Goal status: INITIAL   3.  Patient will exhibit right ankle strength grossly >/= 4+/5 MMT in order to improve standing and walking  tolerance.  Baseline: patient exhibits gross ankle strength deficit,  Goal status: INITIAL   4.  Patient will report ability to stand and ambulate >/= 30 minutes without increased pain in order to improve work related ability and community access. Baseline: patient limited to 15-20 minutes due to pain Goal status: INITIAL     PLAN: PT FREQUENCY: 1-2x/week   PT DURATION: 8 weeks   PLANNED INTERVENTIONS: Therapeutic exercises, Therapeutic activity, Neuromuscular re-education, Balance training, Gait training, Patient/Family education, Joint manipulation, Joint mobilization, Aquatic Therapy, Dry Needling, Electrical stimulation, Cryotherapy, Moist heat, Taping, Ultrasound, Ionotophoresis 77m/ml Dexamethasone, Manual therapy, and Re-evaluation   PLAN FOR NEXT SESSION: Review HEP and progress PRN, manual/dry needling for peroneals and calf, stretching for ankle DF, gross ankle strengthening, progress to weight bearing exercises as tolerated    SEvelene Croon PTA 06/01/2022, 4:47 PM

## 2022-06-06 NOTE — Therapy (Addendum)
OUTPATIENT PHYSICAL THERAPY TREATMENT NOTE  DISCHARGE   Patient Name: Kelly Ward MRN: [id number] DOB:1966/05/09, 56 y.o., female Today's Date: 06/07/2022  PCP: Willey Blade, MD REFERRING PROVIDER: Criselda Peaches, DPM  END OF SESSION:   PT End of Session - 06/07/22 1739     Visit Number 3    Number of Visits 8    Date for PT Re-Evaluation 07/17/22    Authorization Type BCBS    PT Start Time 1700    PT Stop Time 1730    PT Time Calculation (min) 30 min    Activity Tolerance Patient tolerated treatment well;Other (comment)   limited by migraine   Behavior During Therapy [facility] for tasks assessed/performed              Past Medical History:  Diagnosis Date   Allergies    Anemia    Fibromyalgia    Food allergy    Gallbladder problem    Glaucoma    both eyes   Pre-diabetes    Severe obesity (BMI >= 40) ([place]) 05/29/2016   bmi 3   Vitamin B 12 deficiency    Vitamin D deficiency    Past Surgical History:  Procedure Laterality Date   CHOLECYSTECTOMY N/A 05/28/2016   Procedure: LAPAROSCOPIC CHOLECYSTECTOMY ;  Surgeon: Rolm Bookbinder, MD;  Location: [place];  Service: General;  Laterality: N/A;   COLONOSCOPY WITH PROPOFOL N/A 12/16/2015   Procedure: COLONOSCOPY WITH PROPOFOL;  Surgeon: Juanita Craver, MD;  [facility];  Service: Endoscopy;  Laterality: N/A;   HYSTERECTOMY ABDOMINAL WITH SALPINGECTOMY N/A 03/12/2020   Procedure: HYSTERECTOMY ABDOMINAL WITH BILATERAL SALPINGECTOMY;  Surgeon: Servando Salina, MD;  Location: [place];  Service: Gynecology;  Laterality: N/A;   NO PAST SURGERIES     Patient Active Problem List   Diagnosis Date Noted   Abnormal perimenopausal bleeding 03/14/2020   Uterine fibroid 03/12/2020   Status post total hysterectomy 03/12/2020   Severe obesity (BMI >= 40) ([place]) 05/29/2016    REFERRING DIAG: Peroneal tendinitis of right lower extremity  THERAPY DIAG:  Pain in right ankle and joints of right foot  Muscle  weakness (generalized)  Other abnormalities of gait and mobility  Rationale for Evaluation and Treatment Rehabilitation  PERTINENT HISTORY: None  PRECAUTIONS: None  SUBJECTIVE: Patient reports migraine today that is limiting her. Reports foot is still painful with minimal improvement.  PAIN:  Are you having pain? Yes:  NPRS scale: 8/10 (can be 10/10 with walking) Pain location: Right ankle / foot, lateral Pain description: Constant, sharp, throbbing Aggravating factors: Walking, standing Relieving factors: Medication, ankle brace   OBJECTIVE: (objective measures completed at initial evaluation unless otherwise dated) PATIENT SURVEYS:  FOTO 67% functional status   EDEMA:  Not assessed   MUSCLE LENGTH: Calf flexibility deficit   POSTURE:  Patient exhibits pes planus, she rests with ankle in inversion while seated   PALPATION: Tender to palpation base of 5th met and peroneal muscle belly with palpable trigger points   LOWER EXTREMITY ROM:   Active ROM Right eval Left eval  Ankle dorsiflexion 0 -  Ankle plantarflexion 40 -  Ankle inversion 45 -  Ankle eversion 15 -    LOWER EXTREMITY MMT:   MMT Right eval Left eval  Hip flexion 4 4  Hip extension 3 3  Hip abduction 3 3  Knee flexion 5 5  Knee extension 5 5  Ankle dorsiflexion 4 -  Ankle plantarflexion 3- -  Ankle inversion 4- -  Ankle  eversion 4- -    FUNCTIONAL TESTS:  SLS: unable to tolerate on right   GAIT: Assistive device utilized: None Level of assistance: Complete Independence Comments: Antalgic on right with toe out, bilateral pes planus with patient wearing sandals without arch support and ankle brace on right     TODAY'S TREATMENT: [facility] Adult PT Treatment:                                                DATE: 06/07/2022 Therapeutic Exercise: Nustep L6 x 5 mins with UE/LE while taking subjective Slant board calf stretch 3 x 30 sec Standing heel raises 2 x 20 4-way ankle with red 2 x  20   [facility] Adult PT Treatment:                                                DATE: 06/01/2022 Therapeutic Exercise: Nustep level 5 x 5 mins while gathering subjective Slant board calf stretch x1' 4 way ankle RTB x10 each BAPS PF/DF x15 BAPS circles CW/CCW x10 each  Ankle inversion/eversion towel slides 6#  Towel scrunches 2x1' Toe yoga 2x1' (difficult) DF back against wall 2x15 PF facing wall 2x15  [facility] Adult PT Treatment:                                                DATE: 05/22/2022 Therapeutic Exercise: Longsitting ankle PF with red x 20 Seated ankle eversion, inversion, DF with red x 15 each Seated heel toe raises Longsitting calf stretch 2 x 30 sec     PATIENT EDUCATION:  Education details: HEP Person educated: Patient Education method: Explanation Education comprehension: Verbalized understanding, returned demonstration, Verbal cues required, tactile cues required, and needs further education   HOME EXERCISE PROGRAM: Access Code: [email or web address]     ASSESSMENT: CLINICAL IMPRESSION: Patient with fair tolerance for therapy and no adverse effects. She reports migraine today which has limited her activity level and resulted in shortening of therapy. Therapy focused primarily on strength progression this visit. She continues to report throbbing pain localized to base of 5th met stating the knot on the outside of the foot, that occasionally shoots up the side of the lower leg. No changes to HEP this visit. Patient would benefit from continued skilled PT to progress her mobility and strength in order to reduce pain and maximize functional ability.     OBJECTIVE IMPAIRMENTS Abnormal gait, decreased activity tolerance, decreased balance, decreased ROM, decreased strength, impaired flexibility, postural dysfunction, and pain.    ACTIVITY LIMITATIONS standing and locomotion level   PARTICIPATION LIMITATIONS: meal prep, cleaning, shopping, community activity, and occupation   [place], Past/current experiences, Time since onset of injury/illness/exacerbation, and 1 comorbidity: BMI  are also affecting patient's functional outcome.      GOALS: Goals reviewed with patient? Yes   SHORT TERM GOALS: Target date: 06/19/2022    Patient will be I with initial HEP in order to progress with therapy. Baseline: HEP provided at eval Goal status: INITIAL   2.  PT will review FOTO with patient by 3rd visit in order  to understand expected progress and outcome with therapy. Baseline: FOTO assessed at eval Goal status: INITIAL   3.  Patient will report pain with walking and standing </= 6/10 in order to reduce functional limitations Baseline: pain up to 10/10 Goal status: INITIAL   LONG TERM GOALS: Target date: 07/17/2022    Patient will be I with final HEP to maintain progress from PT. Baseline: HEP provided at eval Goal status: INITIAL   2.  Patient will report >/= 73% status on FOTO to indicate improved functional ability. Baseline: 67% Goal status: INITIAL   3.  Patient will exhibit right ankle strength grossly >/= 4+/5 MMT in order to improve standing and walking tolerance.  Baseline: patient exhibits gross ankle strength deficit,  Goal status: INITIAL   4.  Patient will report ability to stand and ambulate >/= 30 minutes without increased pain in order to improve work related ability and community access. Baseline: patient limited to 15-20 minutes due to pain Goal status: INITIAL     PLAN: PT FREQUENCY: 1-2x/week   PT DURATION: 8 weeks   PLANNED INTERVENTIONS: Therapeutic exercises, Therapeutic activity, Neuromuscular re-education, Balance training, Gait training, Patient/Family education, Joint manipulation, Joint mobilization, Aquatic Therapy, Dry Needling, Electrical stimulation, Cryotherapy, Moist heat, Taping, Ultrasound, Ionotophoresis 65m/ml Dexamethasone, Manual therapy, and Re-evaluation   PLAN FOR NEXT SESSION: Review HEP and progress PRN,  manual/dry needling for peroneals and calf, stretching for ankle DF, gross ankle strengthening, progress to weight bearing exercises as tolerated    CHilda Blades PT, DPT, LAT, ATC 06/07/22  5:40 PM Phone: [phone]: [phone]  PHYSICAL THERAPY DISCHARGE SUMMARY  Visits from Start of Care: 3  Current functional level related to goals / functional outcomes: SEE ABOVE   Remaining deficits: See above   Education / Equipment: HEP   Patient agrees to discharge. Patient goals were not met. Patient is being discharged due to not returning since the last visit.

## 2022-06-07 ENCOUNTER — Encounter: Payer: Self-pay | Admitting: Physical Therapy

## 2022-06-07 ENCOUNTER — Other Ambulatory Visit: Payer: Self-pay

## 2022-06-07 ENCOUNTER — Ambulatory Visit: Payer: BC Managed Care – PPO | Admitting: Physical Therapy

## 2022-06-07 DIAGNOSIS — R2689 Other abnormalities of gait and mobility: Secondary | ICD-10-CM | POA: Diagnosis not present

## 2022-06-07 DIAGNOSIS — M7671 Peroneal tendinitis, right leg: Secondary | ICD-10-CM | POA: Diagnosis not present

## 2022-06-07 DIAGNOSIS — M25571 Pain in right ankle and joints of right foot: Secondary | ICD-10-CM

## 2022-06-07 DIAGNOSIS — M6281 Muscle weakness (generalized): Secondary | ICD-10-CM | POA: Diagnosis not present

## 2022-06-14 ENCOUNTER — Ambulatory Visit: Payer: BC Managed Care – PPO | Admitting: Physical Therapy

## 2022-06-19 ENCOUNTER — Other Ambulatory Visit: Payer: BC Managed Care – PPO

## 2022-06-20 ENCOUNTER — Ambulatory Visit: Payer: BC Managed Care – PPO | Admitting: Podiatry

## 2022-06-20 ENCOUNTER — Encounter: Payer: Self-pay | Admitting: Podiatry

## 2022-06-20 ENCOUNTER — Other Ambulatory Visit (HOSPITAL_COMMUNITY): Payer: Self-pay

## 2022-06-20 ENCOUNTER — Ambulatory Visit (INDEPENDENT_AMBULATORY_CARE_PROVIDER_SITE_OTHER): Payer: BC Managed Care – PPO

## 2022-06-20 ENCOUNTER — Other Ambulatory Visit: Payer: Self-pay

## 2022-06-20 DIAGNOSIS — M2141 Flat foot [pes planus] (acquired), right foot: Secondary | ICD-10-CM | POA: Diagnosis not present

## 2022-06-20 DIAGNOSIS — M2142 Flat foot [pes planus] (acquired), left foot: Secondary | ICD-10-CM

## 2022-06-20 DIAGNOSIS — M7671 Peroneal tendinitis, right leg: Secondary | ICD-10-CM

## 2022-06-20 NOTE — Progress Notes (Signed)
  Subjective:  Patient ID: Kelly Ward, female    DOB: 1966/04/12,  MRN: [id number]  Chief Complaint  Patient presents with   Routine Post Op     6 weeks for re-check peroneal tendinitis, pain level at a 3,heat and ice baths, brace and exercises help aide in treatment but patient feels it is not helping    56 y.o. female presents with the above complaint. History confirmed with patient.  She fell about a year ago and injured her left leg and the right foot pain developed slowly after that.  Its been going on for few months now.  She wonders if orthotics would support the foot and improve this   Interval history: Has had some improvement she rates it as a 3 out of 10 in pain now.    Objective:  Physical Exam: warm, good capillary refill, no trophic changes or ulcerative lesions, normal DP and PT pulses, and normal sensory exam.  Right Foot: Tenderness on palpation to the insertion of the peroneus brevis at the fifth metatarsal base, no ecchymosis or evidence of fracture, strength is improved at 5 out of 5, no retromalleolar pain  No images are attached to the encounter.  Radiographs: Multiple views x-ray of the right foot: no fracture, dislocation, swelling or degenerative changes noted, there is some periosteal reaction at the insertion of the fifth met base Assessment:   1. Peroneal tendinitis of right lower extremity   2. Pes planus of both feet       Plan:  Patient was evaluated and treated and all questions answered.  Discussed the etiology and treatment options for peroneal tendinitis including stretching, formal physical therapy with an eccentric exercises therapy plan, supportive shoegears such as a running shoe or sneaker, bracing and orthotics, topical and oral medications.  We also discussed that I do not routinely perform injections in this area because of the risk of an increased damage or rupture of the tendon.  We also discussed the role of surgical  treatment of this for patients who do not improve after exhausting non-surgical treatment options.  She has had some improvement albeit not 100%.  I recommend we continue using the Tri-Lock ankle brace and her physical therapy plan.  Orthotics were dispensed today t and I think this will give her additional support.  We discussed the option of a cam boot for mobilization but will hold off on this unless she does not improve by the next visit.  MRI may give [place] more information about the quality of the tendon at its insertion but right now I do not think it will change our treatment plan.  She will follow-up in 1 month or sooner if it does not improve  Return in about 1 month (around 07/21/2022) for re-check peroneal tendinitis.

## 2022-06-21 ENCOUNTER — Ambulatory Visit: Payer: BC Managed Care – PPO | Admitting: Physical Therapy

## 2022-06-21 NOTE — Progress Notes (Signed)
Patient presents today to pick up custom molded foot orthotics, diagnosed with pes planus by Dr. Sherryle Lis.   Orthotics were dispensed and fit was satisfactory. Reviewed instructions for break-in and wear. Written instructions given to patient.  Patient will follow up as needed.   [facility] - order #  [id number]

## 2022-06-28 ENCOUNTER — Encounter (INDEPENDENT_AMBULATORY_CARE_PROVIDER_SITE_OTHER): Payer: Self-pay

## 2022-06-29 ENCOUNTER — Telehealth: Payer: Self-pay

## 2022-07-03 ENCOUNTER — Telehealth: Payer: Self-pay | Admitting: Podiatry

## 2022-07-03 NOTE — Telephone Encounter (Signed)
Patient said that you recommended she work from home. How long would you like to have this accommodation in place?

## 2022-07-03 NOTE — Telephone Encounter (Signed)
No further notes are needed

## 2022-07-14 ENCOUNTER — Other Ambulatory Visit (HOSPITAL_COMMUNITY): Payer: Self-pay

## 2022-07-17 ENCOUNTER — Other Ambulatory Visit (HOSPITAL_COMMUNITY): Payer: Self-pay

## 2022-07-17 ENCOUNTER — Other Ambulatory Visit: Payer: Self-pay

## 2022-07-31 ENCOUNTER — Other Ambulatory Visit: Payer: Self-pay | Admitting: Internal Medicine

## 2022-07-31 DIAGNOSIS — Z1231 Encounter for screening mammogram for malignant neoplasm of breast: Secondary | ICD-10-CM

## 2022-08-01 ENCOUNTER — Encounter: Payer: Self-pay | Admitting: Podiatry

## 2022-08-01 ENCOUNTER — Ambulatory Visit: Payer: BC Managed Care – PPO | Admitting: Podiatry

## 2022-08-01 ENCOUNTER — Other Ambulatory Visit (HOSPITAL_COMMUNITY): Payer: Self-pay

## 2022-08-01 DIAGNOSIS — M7671 Peroneal tendinitis, right leg: Secondary | ICD-10-CM

## 2022-08-01 MED ORDER — METHYLPREDNISOLONE 4 MG PO TBPK
ORAL_TABLET | ORAL | 0 refills | Status: DC
  Filled 2022-08-01: qty 21, 6d supply, fill #0

## 2022-08-03 NOTE — Progress Notes (Signed)
  Subjective:  Patient ID: Kelly Ward, female    DOB: 1966-02-22,  MRN: [id number]  Chief Complaint  Patient presents with   Follow-up    6 wk follow up re-check peroneal tendinitis-patient is still experiencing pain. Patient has had the MRI done. Using Trilock brace.     56 y.o. female presents with the above complaint. History confirmed with patient.  She fell about a year ago and injured her left leg and the right foot pain developed slowly after that.  Its been going on for few months now.  She wonders if orthotics would support the foot and improve this   Interval history: Back to where it was still very painful.  Objective:  Physical Exam: warm, good capillary refill, no trophic changes or ulcerative lesions, normal DP and PT pulses, and normal sensory exam.  Right Foot: Tenderness on palpation to the insertion of the peroneus brevis at the fifth metatarsal base, no ecchymosis or evidence of fracture, strength is improved at 5 out of 5, no retromalleolar pain  No images are attached to the encounter.  Radiographs: Multiple views x-ray of the right foot: no fracture, dislocation, swelling or degenerative changes noted, there is some periosteal reaction at the insertion of the fifth met base Assessment:   1. Peroneal tendinitis of right lower extremity        Plan:  Patient was evaluated and treated and all questions answered.  Discussed the etiology and treatment options for peroneal tendinitis including stretching, formal physical therapy with an eccentric exercises therapy plan, supportive shoegears such as a running shoe or sneaker, bracing and orthotics, topical and oral medications.  We also discussed that I do not routinely perform injections in this area because of the risk of an increased damage or rupture of the tendon.  We also discussed the role of surgical treatment of this for patients who do not improve after exhausting non-surgical treatment  options.  Once again very painful.  The orthotics were helpful but I think at this point she should return back to her cam walker boot.  I recommend an MRI as well.  She thinks she may have had 1 of these done already at [facility].  There is no record of this but we will try to obtain this from their office.  For now I recommend she continue to work from home until November 1.  Rx methylprednisolone taper sent to pharmacy  Return in about 1 month (around 08/31/2022) for follow up on tendinitis, MRI.

## 2022-08-14 NOTE — Progress Notes (Signed)
Spoke to the patient and she tried calling [facility] but was unsuccessful. Patient is requesting a new order be put in for her MRI at [facility].

## 2022-08-14 NOTE — Progress Notes (Signed)
Contacted [facility] but have not received anything yet. Plus the printer is currently down this morning because it is broken bot sure when it will be up and running again. I reached out to the patient as well asking if she can get her records from them as well as that is the quickest way. Patient is going to call them today to see if she can get her records for [place].

## 2022-08-14 NOTE — Addendum Note (Signed)
Addended bySherryle Lis, Gina Costilla R on: 08/14/2022 12:18 PM   Modules accepted: Orders

## 2022-08-17 ENCOUNTER — Encounter: Payer: Self-pay | Admitting: Physical Therapy

## 2022-08-31 ENCOUNTER — Ambulatory Visit: Payer: BC Managed Care – PPO | Admitting: Podiatry

## 2022-08-31 ENCOUNTER — Ambulatory Visit
Admission: RE | Admit: 2022-08-31 | Discharge: 2022-08-31 | Disposition: A | Discharge status: Home / Self Care | Payer: BC Managed Care – PPO | Source: Ambulatory Visit | Attending: Podiatry | Admitting: Podiatry

## 2022-08-31 DIAGNOSIS — M7671 Peroneal tendinitis, right leg: Secondary | ICD-10-CM

## 2022-08-31 DIAGNOSIS — R600 Localized edema: Secondary | ICD-10-CM | POA: Diagnosis not present

## 2022-08-31 DIAGNOSIS — Q666 Other congenital valgus deformities of feet: Secondary | ICD-10-CM | POA: Diagnosis not present

## 2022-08-31 DIAGNOSIS — M19071 Primary osteoarthritis, right ankle and foot: Secondary | ICD-10-CM | POA: Diagnosis not present

## 2022-09-12 ENCOUNTER — Encounter: Payer: Self-pay | Admitting: Podiatry

## 2022-09-12 ENCOUNTER — Telehealth: Payer: Self-pay

## 2022-09-12 ENCOUNTER — Ambulatory Visit: Payer: BC Managed Care – PPO | Admitting: Podiatry

## 2022-09-12 DIAGNOSIS — R7309 Other abnormal glucose: Secondary | ICD-10-CM | POA: Insufficient documentation

## 2022-09-12 DIAGNOSIS — M7752 Other enthesopathy of left foot: Secondary | ICD-10-CM

## 2022-09-12 DIAGNOSIS — M7751 Other enthesopathy of right foot: Secondary | ICD-10-CM

## 2022-09-12 DIAGNOSIS — E88819 Insulin resistance, unspecified: Secondary | ICD-10-CM | POA: Insufficient documentation

## 2022-09-12 DIAGNOSIS — E559 Vitamin D deficiency, unspecified: Secondary | ICD-10-CM | POA: Insufficient documentation

## 2022-09-12 IMAGING — US US EXTREM LOW VENOUS*L*
1 series · 13 of 24 positions shown · non-contrast
Comparison: None.

CLINICAL DATA: Left lower extremity edema

EXAM:
LEFT LOWER EXTREMITY VENOUS DOPPLER ULTRASOUND
TECHNIQUE: Gray-scale sonography with compression, as well as color and duplex
ultrasound, were performed to evaluate the deep venous system(s)
from the level of the common femoral vein through the popliteal and
proximal calf veins.

[Series 1: us extrem low venous*left* · 0.08mm/px · 13 of 45 slices shown]
[im 1/45]
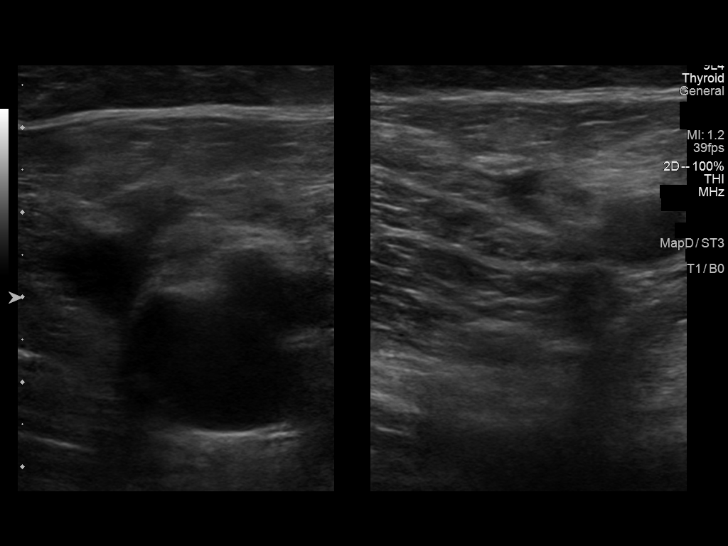
[im 4/45]
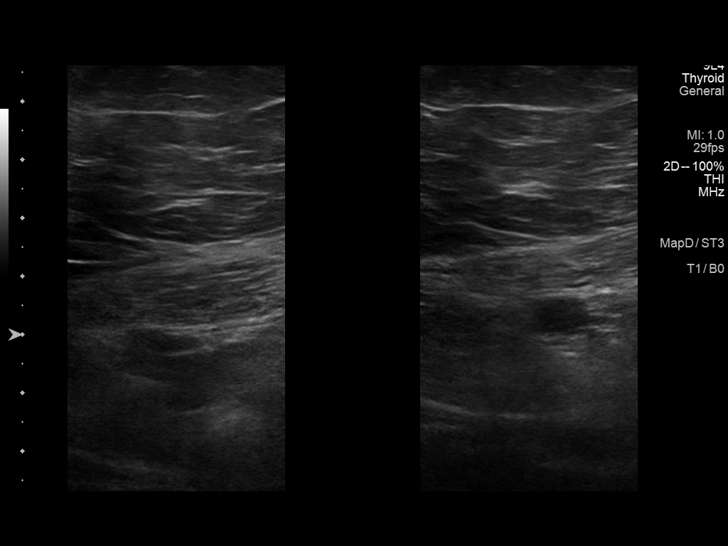
[im 8/45]
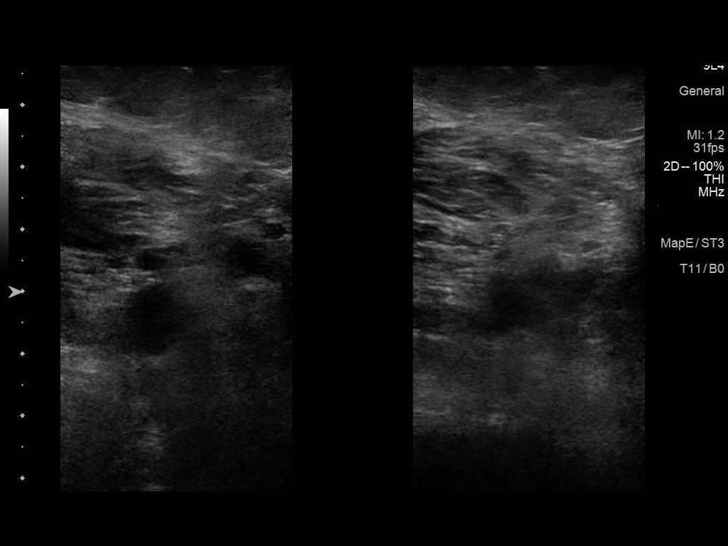
[im 12/45]
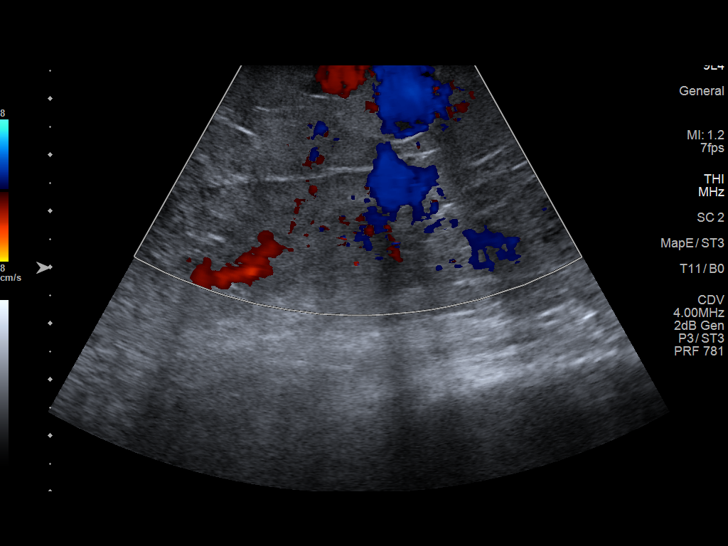
[im 16/45]
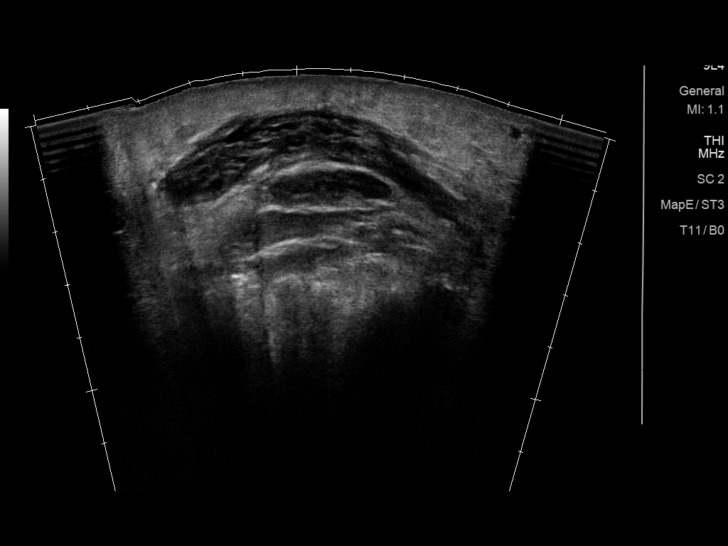
[im 20/45]
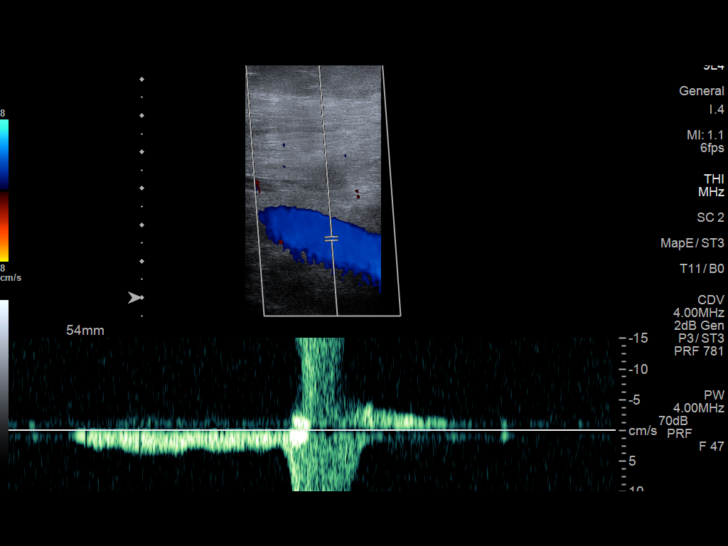
[im 23/45]
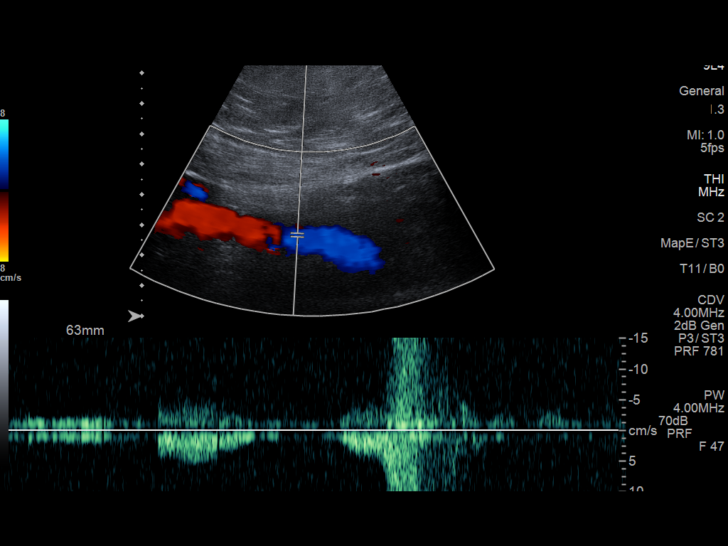
[im 25/45]
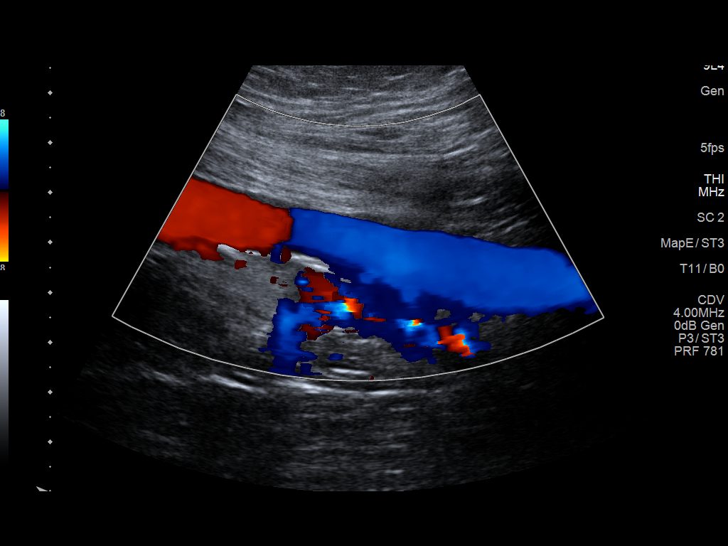
[im 29/45]
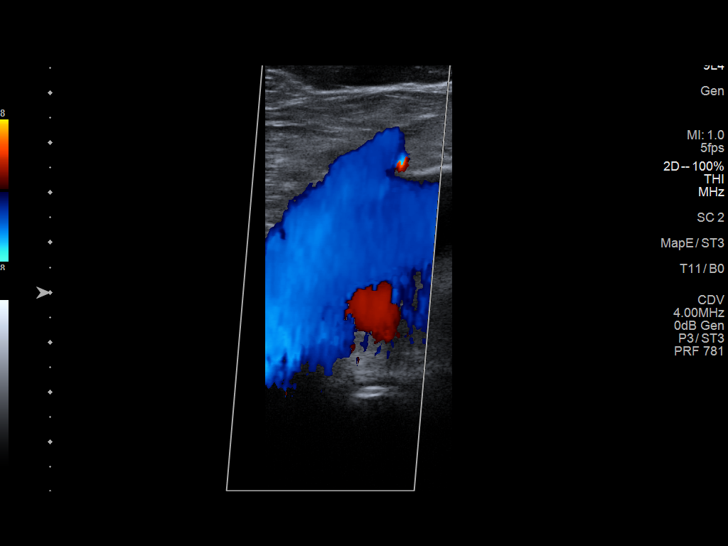
[im 33/45]
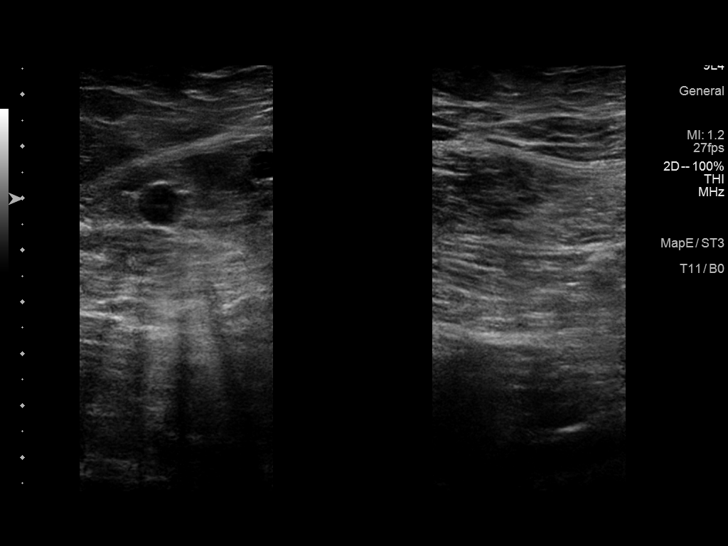
[im 37/45]
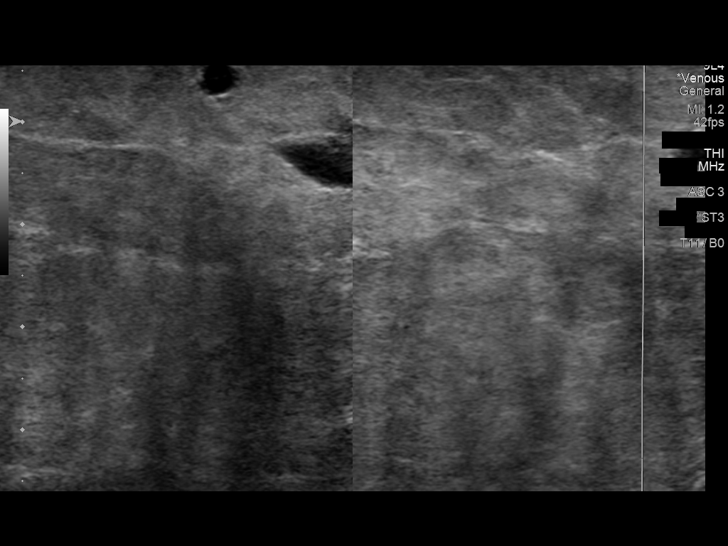
[im 41/45]
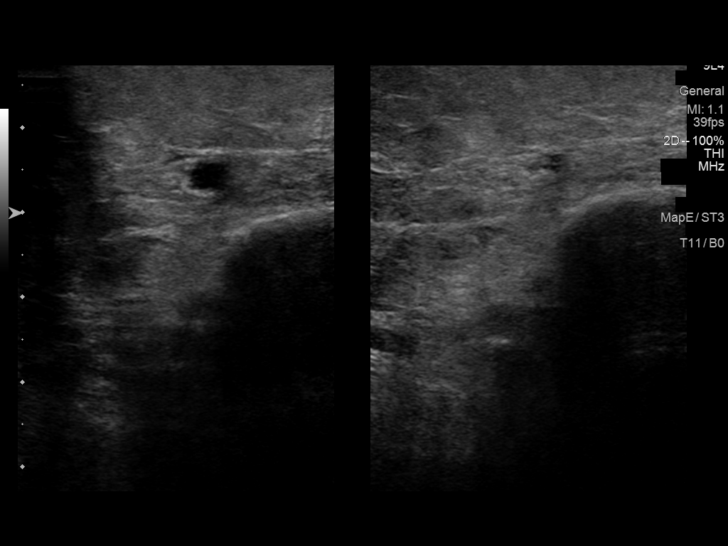
[im 45/45]
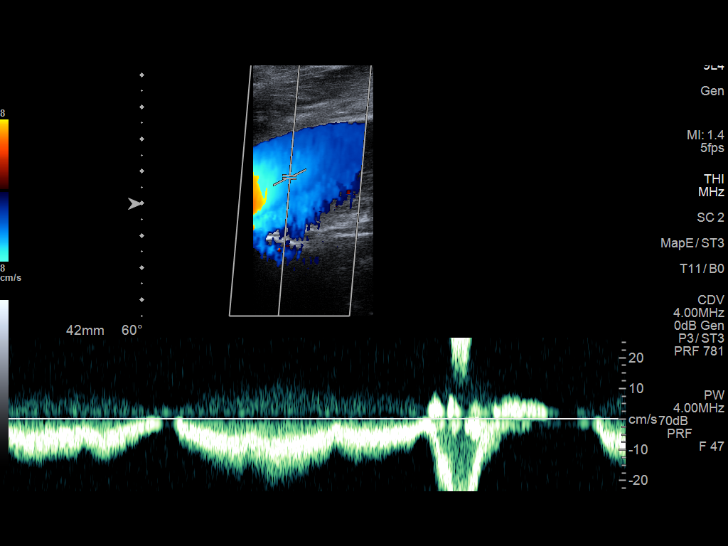

[13 of 24 positions shown; findings below may reference images not displayed]

FINDINGS: VENOUS

Normal compressibility of the common femoral, superficial femoral,
and popliteal veins, as well as the visualized calf veins.
Visualized portions of profunda femoral vein and great saphenous
vein unremarkable. No filling defects to suggest DVT on grayscale or
color Doppler imaging. Doppler waveforms show normal direction of
venous flow, normal respiratory plasticity and response to
augmentation.

Limited views of the contralateral common femoral vein are
unremarkable.

OTHER

Focal interrogation of the region of clinical concern demonstrates
multiple tortuous and enlarged superficial venous varicosities at
the medial aspect of the knee. Additionally, interrogation of the
prepatellar soft tissues demonstrates an elongated heterogeneous
fluid collection measuring approximately 1 cm in depth, 5.4 cm in
width and 7.7 cm in length. The collection overlies the patellar
ligament and proximal tibia.

Limitations: none
IMPRESSION: 1. No evidence of deep venous thrombosis.
2. Enlarged, torch will superficial venous varicosities at the
medial aspect of the knee.
3. Heterogeneous ovoid fluid collection in the prepatellar soft
tissues overlying the patellar ligament and proximal tibial plateau.
Findings suggest hematoma. Did the patient suffer a recent fall?

## 2022-09-13 ENCOUNTER — Encounter: Payer: Self-pay | Admitting: Podiatry

## 2022-09-13 NOTE — Telephone Encounter (Signed)
Note has been created and sent to the patients mychart.

## 2022-09-14 NOTE — Progress Notes (Signed)
Subjective:  Patient ID: Kelly Ward, female    DOB: 1966/11/10,  MRN: [id number]  Chief Complaint  Patient presents with   Foot Pain   Toe Pain    Follow up peroneal tendonitis right   "Its has good days and bad days"   MRI results    56 y.o. female presents with the above complaint. History confirmed with patient.  She fell about a year ago and injured her left leg and the right foot pain developed slowly after that.  Its been going on for few months now.  She wonders if orthotics would support the foot and improve this   Interval history: Back to where it was still very painful.  Objective:  Physical Exam: warm, good capillary refill, no trophic changes or ulcerative lesions, normal DP and PT pulses, and normal sensory exam.  Right Foot: Tenderness on palpation to the insertion of the peroneus brevis at the fifth metatarsal base, no ecchymosis or evidence of fracture, no pain in the anterior ankle joint line medially or laterally, good range of motion of the ankle  No images are attached to the encounter.  Radiographs: Multiple views x-ray of the right foot: no fracture, dislocation, swelling or degenerative changes noted, there is some periosteal reaction at the insertion of the fifth met base  Study Result  Narrative & Impression  CLINICAL DATA:  Ankle pain, tendon abnormality suspected, neg xray   EXAM: MRI OF THE RIGHT ANKLE WITHOUT CONTRAST   TECHNIQUE: Multiplanar, multisequence MR imaging of the ankle was performed. No intravenous contrast was administered.   COMPARISON:  Right foot x-ray 05/08/2022   FINDINGS: TENDONS   Peroneal: Peroneus longus and brevis tendons are intact and normally positioned.   Posteromedial: Tibialis posterior, flexor hallucis longus, and flexor digitorum longus tendons are intact and normally positioned.   Anterior: Tibialis anterior, extensor hallucis longus, and extensor digitorum longus tendons are intact and  normally positioned.   Achilles: Intact.   Plantar Fascia: Intact.   LIGAMENTS   Lateral: Intact tibiofibular ligaments. The anterior and posterior talofibular ligaments are intact. Thickened appearance of the ATFL suggests sequela of remote trauma. Intact calcaneofibular ligament.   Medial: The deltoid and visualized portions of the spring ligament appear intact.   CARTILAGE AND BONES   Ankle Joint: 5 mm full-thickness cartilage defect at the medial talar shoulder with 3 mm underlying osteochondral defect with mild marrow edema. Focal cartilage loss at the lateral talar shoulder with 6 x 3 mm osteochondral defect. No significant tibiotalar joint effusion.   Subtalar Joints/Sinus Tarsi: No cartilage defect. No effusion. Preservation of the anatomic fat within the sinus tarsi.   Bones: No acute fracture. Pes planovalgus alignment. No extra-articular sites of bone marrow edema. No suspicious bone lesion.   Other: 8 x 5 mm ganglion cyst noted along the dorsomedial margin of the naviculocuneiform joint. No significant soft tissue findings.   IMPRESSION: 1. Mild tibiotalar osteoarthritis with small osteochondral defect at the medial and lateral talar shoulders. 2. Pes planovalgus alignment. 3. Thickened appearance of the ATFL suggests sequela of remote trauma.     Electronically Signed   By: Davina Poke D.O.   On: 09/02/2022 13:58   Assessment:   1. Bursitis of left foot         Plan:  Patient was evaluated and treated and all questions answered.  Continues to be quite limiting and painful.  She continues to be WBAT in a Tri-Lock ankle brace and shoe with her inserts.  Understandably she is very frustrated.  I reviewed her MRI images in detail with her today and I agree with the radiology interpretation, there is no significant abnormality of the bony interface of the peroneus brevis or within the tendon itself.  She describes much of the pain from being  burning radiating pain that is random and not always consistent with ambulation or activity.  Some of this could be irritation of the sural nerve or neuritis as well.  I found the recommended corticosteroid injection today.  Following sterile prep with alcohol the lateral fifth metatarsal base and possible overlying bursa was injected with 1 cc of Marcaine and 4 mg of dexamethasone.  She tolerated this well.  If not improved within 2 to 3 weeks and I would recommend possible evaluation with EMG/NCV  Return in about 4 weeks (around 10/10/2022) for follow up after right foot injection .

## 2022-09-21 ENCOUNTER — Other Ambulatory Visit (HOSPITAL_COMMUNITY): Payer: Self-pay

## 2022-09-21 ENCOUNTER — Ambulatory Visit: Payer: BC Managed Care – PPO | Admitting: Podiatry

## 2022-09-21 DIAGNOSIS — M5431 Sciatica, right side: Secondary | ICD-10-CM | POA: Diagnosis not present

## 2022-09-21 DIAGNOSIS — M7051 Other bursitis of knee, right knee: Secondary | ICD-10-CM

## 2022-09-21 MED ORDER — MELOXICAM 15 MG PO TABS
15.0000 mg | ORAL_TABLET | Freq: Every day | ORAL | 3 refills | Status: AC
  Filled 2022-09-21: qty 30, 30d supply, fill #0

## 2022-09-21 MED ORDER — METHYLPREDNISOLONE 4 MG PO TBPK
ORAL_TABLET | ORAL | 0 refills | Status: DC
  Filled 2022-09-21: qty 21, 6d supply, fill #0

## 2022-09-21 NOTE — Progress Notes (Signed)
Subjective:  Patient ID: Kelly Ward, female    DOB: 18-Nov-1966,  MRN: [id number]  Chief Complaint  Patient presents with   Foot Pain    Pt states pain in right foot has been getting worse, she states pain is shooting up to her knee and it is effecting her everyday activities     56 y.o. female presents with the above complaint. History confirmed with patient.  She fell about a year ago and injured her left leg and the right foot pain developed slowly after that.  Its been going on for few months now.  She wonders if orthotics would support the foot and improve this   Interval history: It severely worsened and is now feeling it mostly in her knee and down her back around the knee  Objective:  Physical Exam: warm, good capillary refill, no trophic changes or ulcerative lesions, normal DP and PT pulses, and normal sensory exam.  Right Foot: Still some tenderness at the insertion of the peroneus brevis at the fifth metatarsal base, she has pain with a positive straight leg raise test, pain and swelling around her knee  No images are attached to the encounter.  Radiographs: Multiple views x-ray of the right foot: no fracture, dislocation, swelling or degenerative changes noted, there is some periosteal reaction at the insertion of the fifth met base  Study Result  Narrative & Impression  CLINICAL DATA:  Ankle pain, tendon abnormality suspected, neg xray   EXAM: MRI OF THE RIGHT ANKLE WITHOUT CONTRAST   TECHNIQUE: Multiplanar, multisequence MR imaging of the ankle was performed. No intravenous contrast was administered.   COMPARISON:  Right foot x-ray 05/08/2022   FINDINGS: TENDONS   Peroneal: Peroneus longus and brevis tendons are intact and normally positioned.   Posteromedial: Tibialis posterior, flexor hallucis longus, and flexor digitorum longus tendons are intact and normally positioned.   Anterior: Tibialis anterior, extensor hallucis longus, and  extensor digitorum longus tendons are intact and normally positioned.   Achilles: Intact.   Plantar Fascia: Intact.   LIGAMENTS   Lateral: Intact tibiofibular ligaments. The anterior and posterior talofibular ligaments are intact. Thickened appearance of the ATFL suggests sequela of remote trauma. Intact calcaneofibular ligament.   Medial: The deltoid and visualized portions of the spring ligament appear intact.   CARTILAGE AND BONES   Ankle Joint: 5 mm full-thickness cartilage defect at the medial talar shoulder with 3 mm underlying osteochondral defect with mild marrow edema. Focal cartilage loss at the lateral talar shoulder with 6 x 3 mm osteochondral defect. No significant tibiotalar joint effusion.   Subtalar Joints/Sinus Tarsi: No cartilage defect. No effusion. Preservation of the anatomic fat within the sinus tarsi.   Bones: No acute fracture. Pes planovalgus alignment. No extra-articular sites of bone marrow edema. No suspicious bone lesion.   Other: 8 x 5 mm ganglion cyst noted along the dorsomedial margin of the naviculocuneiform joint. No significant soft tissue findings.   IMPRESSION: 1. Mild tibiotalar osteoarthritis with small osteochondral defect at the medial and lateral talar shoulders. 2. Pes planovalgus alignment. 3. Thickened appearance of the ATFL suggests sequela of remote trauma.     Electronically Signed   By: Davina Poke D.O.   On: 09/02/2022 13:58   Assessment:   1. Bursitis of other bursa of right knee   2. Sciatica, right side         Plan:  Patient was evaluated and treated and all questions answered.  At this point considering her clinical  progress and her imaging studies that did not show significant findings on her foot and ankle I think majority of her symptoms are likely related to an issue with her knee or possibly lower extremity radiculopathy or sciatica, her pain today is quite severe and out of proportion to what  I am seeing in her clinical exam and imaging findings within her foot and ankle itself.  I recommended she be evaluated by an orthopedic surgeon for both her knee and her back, urgent referral will be sent to [facility].  She has previously seen [facility] and preferred not to go back there.  For now she will continue use of the boot and I recommended meloxicam and a methylprednisolone taper is refilled and sent to her pharmacy.  Return if symptoms worsen or fail to improve.

## 2022-09-22 ENCOUNTER — Encounter: Payer: Self-pay | Admitting: Physician Assistant

## 2022-09-22 ENCOUNTER — Telehealth: Payer: Self-pay | Admitting: *Deleted

## 2022-09-22 ENCOUNTER — Ambulatory Visit (INDEPENDENT_AMBULATORY_CARE_PROVIDER_SITE_OTHER): Payer: BC Managed Care – PPO

## 2022-09-22 ENCOUNTER — Ambulatory Visit (INDEPENDENT_AMBULATORY_CARE_PROVIDER_SITE_OTHER): Payer: BC Managed Care – PPO | Admitting: Physician Assistant

## 2022-09-22 DIAGNOSIS — M25561 Pain in right knee: Secondary | ICD-10-CM | POA: Diagnosis not present

## 2022-09-22 NOTE — Telephone Encounter (Signed)
Patient has been scheduled for today @ 9:15-[facility].

## 2022-09-22 NOTE — Progress Notes (Signed)
Office Visit Note   Patient: Kelly Ward           Date of Birth: [birth year]           MRN: [id number] Visit Date: 09/22/2022              Requested by: Criselda Peaches, [place],  [place] [address] PCP: Willey Blade, MD  Chief Complaint  Patient presents with   Right Knee - Pain      HPI: Kelly Ward is a pleasant 56 year old woman with a 1 month history of right global knee pain.  She is about a year status post an injury at work on which she fell down onto her left knee and foot.  She has been seeing podiatry for both of her feet and her ankle.  Over the course of the last month she has had increasing right knee pain which she thinks may be because of overcompensation because of her left knee.  She states this is no longer a work comp issue with regards to the right side.  She has had no previous injury to the right knee however did play a lot of sports.  She did try to use a brace but it did not fit her properly.  She saw her podiatrist yesterday who prescribed a steroid taper as well as meloxicam.  She also had a steroid injection in her foot last week.  Assessment & Plan: Visit Diagnoses:  1. Acute pain of right knee     Plan: Findings consistent with osteoarthritis of her right knee which may have been aggravated by having to rely on his leg for weightbearing.  We talked about the natural history of this and reviewed her x-rays.  As she is started her steroid taper and recently had a steroid injection and her history of insulin resistance I would defer doing an injection on her today.  I did tell her not to take the meloxicam until she finished the steroid taper.  Would like her to follow-up in 3 weeks.  At that point I think we could fairly safely do a steroid injection into her knee.  If she has significant symptoms could consider an MRI.  Also discussed with her topical Voltaren gel  Follow-Up Instructions: Return in about 1 week (around  09/29/2022).   Ortho Exam  Patient is alert, oriented, no adenopathy, well-dressed, normal affect, normal respiratory effort. Right knee no effusion no erythema no warmth.  Compartments are soft and nontender she is wearing an ankle brace on the right negative Bevelyn Buckles' sign she has tenderness over the medial lateral joint line.  She does have grinding with range of motion good varus valgus stability she is neurovascularly intact.  She did she does have quite a few varicosities in the right lower extremity.  She does have an antalgic gait.  She is able to sustain a straight leg raise  Imaging: No results found. No images are attached to the encounter.  Labs: Lab Results  Component Value Date   HGBA1C 5.8 (H) 02/23/2021   HGBA1C 5.4 03/05/2020   LABURIC 6.1 05/20/2008   REPTSTATUS 08/28/2019 FINAL 08/27/2019   CULT MULTIPLE SPECIES PRESENT, SUGGEST RECOLLECTION (A) 08/27/2019     Lab Results  Component Value Date   ALBUMIN 4.2 02/23/2021   ALBUMIN 3.6 08/27/2019   ALBUMIN 2.9 (L) 05/28/2016    No results found for: "MG" Lab Results  Component Value Date   VD25OH 30.2 02/23/2021  No results found for: "PREALBUMIN"    Latest Ref Rng & Units 02/23/2021    8:45 AM 03/13/2020    7:57 AM 03/05/2020    2:00 PM  CBC EXTENDED  WBC 3.4 - 10.8 x10E3/uL 6.3  8.9  7.2   RBC 3.77 - 5.28 x10E6/uL 4.64  3.87  4.65   Hemoglobin 11.1 - 15.9 g/dL 12.5  10.4  12.5   HCT 34.0 - 46.6 % 38.4  32.9  40.1   Platelets 150 - 450 x10E3/uL 266  228  276   NEUT# 1.4 - 7.0 x10E3/uL 3.3     Lymph# 0.7 - 3.1 x10E3/uL 2.1        There is no height or weight on file to calculate BMI.  Orders:  Orders Placed This Encounter  Procedures   XR KNEE 3 VIEW RIGHT   No orders of the defined types were placed in this encounter.    Procedures: No procedures performed  Clinical Data: No additional findings.  ROS:  All other systems negative, except as noted in the HPI. Review of  Systems  Objective: Vital Signs: LMP 02/09/2020 (Exact Date)   Specialty Comments:  No specialty comments available.  PMFS History: Patient Active Problem List   Diagnosis Date Noted   Pain in right knee 09/22/2022   Vitamin D deficiency 09/12/2022   Insulin resistance 09/12/2022   Abnormal glucose level 09/12/2022   Abnormal perimenopausal bleeding 03/14/2020   Uterine fibroid 03/12/2020   Status post total hysterectomy 03/12/2020   Severe obesity (BMI >= 40) (HCC) 05/29/2016   Skin inflammation 03/06/2016   Alopecia 08/18/2015   Past Medical History:  Diagnosis Date   Allergies    Anemia    Fibromyalgia    Food allergy    Gallbladder problem    Glaucoma    both eyes   Pre-diabetes    Severe obesity (BMI >= 40) ([facility]) 05/29/2016   bmi 36   Vitamin B 12 deficiency    Vitamin D deficiency     Family History  Problem Relation Age of Onset   Breast cancer Sister    Diabetes Mother    High blood pressure Mother    Thyroid disease Mother     Past Surgical History:  Procedure Laterality Date   CHOLECYSTECTOMY N/A 05/28/2016   Procedure: LAPAROSCOPIC CHOLECYSTECTOMY ;  Surgeon: Rolm Bookbinder, MD;  Location: [facility];  Service: General;  Laterality: N/A;   COLONOSCOPY WITH PROPOFOL N/A 12/16/2015   Procedure: COLONOSCOPY WITH PROPOFOL;  Surgeon: Juanita Craver, MD;  Location: [facility];  Service: Endoscopy;  Laterality: N/A;   HYSTERECTOMY ABDOMINAL WITH SALPINGECTOMY N/A 03/12/2020   Procedure: HYSTERECTOMY ABDOMINAL WITH BILATERAL SALPINGECTOMY;  Surgeon: Servando Salina, MD;  Location: [facility];  Service: Gynecology;  Laterality: N/A;   NO PAST SURGERIES     Social History   Occupational History   Occupation: [employer]  Tobacco Use   Smoking status: Never   Smokeless tobacco: Never  Vaping Use   Vaping Use: Never used  Substance and Sexual Activity   Alcohol use: No   Drug use: No   Sexual activity: Not on file

## 2022-09-26 NOTE — Progress Notes (Signed)
Patient has an appt on 10/05/22 with [facility].

## 2022-10-05 ENCOUNTER — Ambulatory Visit: Payer: BC Managed Care – PPO | Admitting: Orthopaedic Surgery

## 2022-10-09 ENCOUNTER — Ambulatory Visit: Payer: BC Managed Care – PPO

## 2022-10-09 ENCOUNTER — Ambulatory Visit
Admission: RE | Admit: 2022-10-09 | Discharge: 2022-10-09 | Disposition: A | Discharge status: Home / Self Care | Payer: BC Managed Care – PPO | Source: Ambulatory Visit | Attending: Internal Medicine | Admitting: Internal Medicine

## 2022-10-09 DIAGNOSIS — Z1231 Encounter for screening mammogram for malignant neoplasm of breast: Secondary | ICD-10-CM | POA: Diagnosis not present

## 2022-10-10 ENCOUNTER — Other Ambulatory Visit (HOSPITAL_COMMUNITY): Payer: Self-pay

## 2022-10-10 ENCOUNTER — Ambulatory Visit: Payer: BC Managed Care – PPO | Admitting: Podiatry

## 2022-10-10 DIAGNOSIS — M79671 Pain in right foot: Secondary | ICD-10-CM | POA: Diagnosis not present

## 2022-10-10 DIAGNOSIS — R7309 Other abnormal glucose: Secondary | ICD-10-CM | POA: Diagnosis not present

## 2022-10-10 DIAGNOSIS — Z6841 Body Mass Index (BMI) 40.0 and over, adult: Secondary | ICD-10-CM | POA: Diagnosis not present

## 2022-10-10 DIAGNOSIS — E559 Vitamin D deficiency, unspecified: Secondary | ICD-10-CM | POA: Diagnosis not present

## 2022-10-10 DIAGNOSIS — Z0001 Encounter for general adult medical examination with abnormal findings: Secondary | ICD-10-CM | POA: Diagnosis not present

## 2022-10-16 ENCOUNTER — Other Ambulatory Visit (HOSPITAL_COMMUNITY): Payer: Self-pay

## 2022-10-16 MED ORDER — WEGOVY 1 MG/0.5ML ~~LOC~~ SOAJ
1.0000 | SUBCUTANEOUS | 3 refills | Status: DC
  Filled 2022-10-16 – 2022-10-17 (×2): qty 2, 28d supply, fill #0
  Filled 2022-11-14 (×2): qty 2, 28d supply, fill #1
  Filled 2022-12-08: qty 2, 28d supply, fill #2

## 2022-10-17 ENCOUNTER — Other Ambulatory Visit (HOSPITAL_COMMUNITY): Payer: Self-pay

## 2022-10-19 DIAGNOSIS — H04123 Dry eye syndrome of bilateral lacrimal glands: Secondary | ICD-10-CM | POA: Diagnosis not present

## 2022-10-19 DIAGNOSIS — H401131 Primary open-angle glaucoma, bilateral, mild stage: Secondary | ICD-10-CM | POA: Diagnosis not present

## 2022-11-14 ENCOUNTER — Other Ambulatory Visit: Payer: Self-pay

## 2022-11-14 ENCOUNTER — Other Ambulatory Visit (HOSPITAL_COMMUNITY): Payer: Self-pay

## 2022-11-21 ENCOUNTER — Other Ambulatory Visit: Payer: Self-pay

## 2022-12-08 ENCOUNTER — Other Ambulatory Visit (HOSPITAL_COMMUNITY): Payer: Self-pay

## 2022-12-12 ENCOUNTER — Other Ambulatory Visit (HOSPITAL_COMMUNITY): Payer: Self-pay

## 2023-01-11 ENCOUNTER — Other Ambulatory Visit (HOSPITAL_COMMUNITY): Payer: Self-pay

## 2023-01-11 MED ORDER — WEGOVY 1.7 MG/0.75ML ~~LOC~~ SOAJ
1.7000 mg | SUBCUTANEOUS | 3 refills | Status: DC
  Filled 2023-01-11: qty 3, 28d supply, fill #0
  Filled 2023-02-05: qty 3, 28d supply, fill #1
  Filled 2023-03-06: qty 3, 28d supply, fill #2

## 2023-01-12 ENCOUNTER — Other Ambulatory Visit (HOSPITAL_COMMUNITY): Payer: Self-pay

## 2023-02-05 ENCOUNTER — Other Ambulatory Visit (HOSPITAL_COMMUNITY): Payer: Self-pay

## 2023-02-14 ENCOUNTER — Other Ambulatory Visit (HOSPITAL_COMMUNITY): Payer: Self-pay

## 2023-02-14 MED ORDER — BIMATOPROST 0.01 % OP SOLN
1.0000 [drp] | Freq: Every day | OPHTHALMIC | 3 refills | Status: DC
  Filled 2023-02-14: qty 2.5, 25d supply, fill #0
  Filled 2023-03-13: qty 2.5, 30d supply, fill #0
  Filled 2023-03-14: qty 7.5, 90d supply, fill #0
  Filled 2023-03-21: qty 2.5, 30d supply, fill #0

## 2023-02-15 ENCOUNTER — Other Ambulatory Visit (HOSPITAL_COMMUNITY): Payer: Self-pay

## 2023-02-15 MED ORDER — OSELTAMIVIR PHOSPHATE 75 MG PO CAPS
75.0000 mg | ORAL_CAPSULE | Freq: Two times a day (BID) | ORAL | 0 refills | Status: DC
  Filled 2023-02-15: qty 14, 7d supply, fill #0

## 2023-02-15 MED ORDER — LATANOPROST 0.005 % OP SOLN
1.0000 [drp] | Freq: Every day | OPHTHALMIC | 11 refills | Status: AC
  Filled 2023-02-15: qty 2.5, 25d supply, fill #0
  Filled 2023-03-13: qty 2.5, 25d supply, fill #1
  Filled 2023-04-03 – 2023-04-17 (×2): qty 2.5, 25d supply, fill #2

## 2023-03-06 ENCOUNTER — Other Ambulatory Visit (HOSPITAL_COMMUNITY): Payer: Self-pay

## 2023-03-07 ENCOUNTER — Other Ambulatory Visit (HOSPITAL_BASED_OUTPATIENT_CLINIC_OR_DEPARTMENT_OTHER): Payer: Self-pay

## 2023-03-13 ENCOUNTER — Other Ambulatory Visit: Payer: Self-pay

## 2023-03-14 ENCOUNTER — Other Ambulatory Visit (HOSPITAL_COMMUNITY): Payer: Self-pay

## 2023-03-21 ENCOUNTER — Other Ambulatory Visit (HOSPITAL_COMMUNITY): Payer: Self-pay

## 2023-03-21 MED ORDER — WEGOVY 2.4 MG/0.75ML ~~LOC~~ SOAJ
2.4000 mg | SUBCUTANEOUS | 3 refills | Status: DC
  Filled 2023-03-21: qty 3, 28d supply, fill #0
  Filled 2023-04-03 – 2023-04-17 (×2): qty 3, 28d supply, fill #1
  Filled 2023-05-16 – 2023-05-22 (×4): qty 3, 28d supply, fill #2

## 2023-03-22 ENCOUNTER — Other Ambulatory Visit (HOSPITAL_COMMUNITY): Payer: Self-pay

## 2023-03-22 MED ORDER — BRIMONIDINE TARTRATE-TIMOLOL 0.2-0.5 % OP SOLN
1.0000 [drp] | Freq: Two times a day (BID) | OPHTHALMIC | 0 refills | Status: DC
  Filled 2023-03-22: qty 5, 25d supply, fill #0

## 2023-04-03 ENCOUNTER — Other Ambulatory Visit: Payer: Self-pay

## 2023-04-17 ENCOUNTER — Other Ambulatory Visit: Payer: Self-pay | Admitting: Internal Medicine

## 2023-04-17 ENCOUNTER — Other Ambulatory Visit (HOSPITAL_COMMUNITY): Payer: Self-pay

## 2023-04-17 DIAGNOSIS — Z1231 Encounter for screening mammogram for malignant neoplasm of breast: Secondary | ICD-10-CM

## 2023-04-27 ENCOUNTER — Other Ambulatory Visit (HOSPITAL_BASED_OUTPATIENT_CLINIC_OR_DEPARTMENT_OTHER): Payer: Self-pay

## 2023-05-16 ENCOUNTER — Other Ambulatory Visit (HOSPITAL_COMMUNITY): Payer: Self-pay

## 2023-05-17 ENCOUNTER — Other Ambulatory Visit (HOSPITAL_COMMUNITY): Payer: Self-pay

## 2023-05-22 ENCOUNTER — Other Ambulatory Visit (HOSPITAL_COMMUNITY): Payer: Self-pay

## 2023-05-22 MED ORDER — WEGOVY 2.4 MG/0.75ML ~~LOC~~ SOAJ
2.4000 mg | SUBCUTANEOUS | 3 refills | Status: DC
  Filled 2023-05-22 – 2023-06-12 (×2): qty 3, 28d supply, fill #0

## 2023-05-23 ENCOUNTER — Other Ambulatory Visit (HOSPITAL_COMMUNITY): Payer: Self-pay

## 2023-06-12 ENCOUNTER — Other Ambulatory Visit (HOSPITAL_COMMUNITY): Payer: Self-pay

## 2023-07-18 ENCOUNTER — Encounter (HOSPITAL_COMMUNITY): Payer: Self-pay

## 2023-07-18 ENCOUNTER — Other Ambulatory Visit (HOSPITAL_COMMUNITY): Payer: Self-pay

## 2023-07-18 MED ORDER — ZEPBOUND 2.5 MG/0.5ML ~~LOC~~ SOAJ
2.5000 mg | SUBCUTANEOUS | 0 refills | Status: DC
  Filled 2023-07-18 – 2023-07-19 (×2): qty 2, 28d supply, fill #0

## 2023-07-19 ENCOUNTER — Other Ambulatory Visit (HOSPITAL_COMMUNITY): Payer: Self-pay

## 2023-08-09 ENCOUNTER — Other Ambulatory Visit (HOSPITAL_COMMUNITY): Payer: Self-pay

## 2023-08-09 MED ORDER — ZEPBOUND 2.5 MG/0.5ML ~~LOC~~ SOAJ
2.5000 mg | SUBCUTANEOUS | 2 refills | Status: DC
  Filled 2023-08-09: qty 2, 28d supply, fill #0
  Filled 2023-09-10: qty 2, 28d supply, fill #1
  Filled 2023-09-27 – 2023-10-01 (×2): qty 2, 28d supply, fill #2

## 2023-09-03 ENCOUNTER — Other Ambulatory Visit (HOSPITAL_COMMUNITY): Payer: Self-pay

## 2023-09-10 ENCOUNTER — Other Ambulatory Visit (HOSPITAL_COMMUNITY): Payer: Self-pay

## 2023-09-27 ENCOUNTER — Other Ambulatory Visit (HOSPITAL_COMMUNITY): Payer: Self-pay

## 2023-10-01 ENCOUNTER — Other Ambulatory Visit (HOSPITAL_COMMUNITY): Payer: Self-pay

## 2023-10-11 ENCOUNTER — Ambulatory Visit
Admission: RE | Admit: 2023-10-11 | Discharge: 2023-10-11 | Disposition: A | Discharge status: Home / Self Care | Payer: BC Managed Care – PPO | Source: Ambulatory Visit | Attending: Internal Medicine | Admitting: Internal Medicine

## 2023-10-11 DIAGNOSIS — Z1231 Encounter for screening mammogram for malignant neoplasm of breast: Secondary | ICD-10-CM

## 2023-10-29 ENCOUNTER — Other Ambulatory Visit (HOSPITAL_COMMUNITY): Payer: Self-pay

## 2023-10-29 MED ORDER — ZEPBOUND 5 MG/0.5ML ~~LOC~~ SOAJ
5.0000 mg | SUBCUTANEOUS | 1 refills | Status: DC
  Filled 2023-10-29: qty 2, 28d supply, fill #0
  Filled 2023-11-22: qty 2, 28d supply, fill #1

## 2023-10-30 ENCOUNTER — Other Ambulatory Visit: Payer: Self-pay | Admitting: Internal Medicine

## 2023-10-30 DIAGNOSIS — Z1231 Encounter for screening mammogram for malignant neoplasm of breast: Secondary | ICD-10-CM

## 2023-11-01 ENCOUNTER — Ambulatory Visit
Admission: RE | Admit: 2023-11-01 | Discharge: 2023-11-01 | Disposition: A | Discharge status: Home / Self Care | Payer: BC Managed Care – PPO | Source: Ambulatory Visit | Attending: Internal Medicine | Admitting: Internal Medicine

## 2023-11-01 DIAGNOSIS — Z1231 Encounter for screening mammogram for malignant neoplasm of breast: Secondary | ICD-10-CM

## 2023-11-15 ENCOUNTER — Other Ambulatory Visit (HOSPITAL_COMMUNITY): Payer: Self-pay

## 2023-11-22 ENCOUNTER — Other Ambulatory Visit (HOSPITAL_COMMUNITY): Payer: Self-pay

## 2023-11-24 ENCOUNTER — Ambulatory Visit (HOSPITAL_COMMUNITY)
Admission: EM | Admit: 2023-11-24 | Discharge: 2023-11-24 | Disposition: A | Discharge status: Home / Self Care | Payer: BC Managed Care – PPO | Attending: Emergency Medicine | Admitting: Emergency Medicine

## 2023-11-24 ENCOUNTER — Ambulatory Visit (INDEPENDENT_AMBULATORY_CARE_PROVIDER_SITE_OTHER): Payer: BC Managed Care – PPO

## 2023-11-24 ENCOUNTER — Encounter (HOSPITAL_COMMUNITY): Payer: Self-pay

## 2023-11-24 DIAGNOSIS — M79642 Pain in left hand: Secondary | ICD-10-CM

## 2023-11-24 DIAGNOSIS — M25532 Pain in left wrist: Secondary | ICD-10-CM

## 2023-11-24 DIAGNOSIS — S4992XA Unspecified injury of left shoulder and upper arm, initial encounter: Secondary | ICD-10-CM | POA: Diagnosis not present

## 2023-11-24 MED ORDER — IBUPROFEN 800 MG PO TABS: 800.0000 mg | ORAL_TABLET | Freq: Three times a day (TID) | ORAL | 0 refills | Status: AC

## 2023-11-24 MED ORDER — IBUPROFEN 800 MG PO TABS
ORAL_TABLET | ORAL | Status: AC
Start: 2023-11-24 — End: ?
  Filled 2023-11-24: qty 1

## 2023-11-24 MED ORDER — IBUPROFEN 800 MG PO TABS
800.0000 mg | ORAL_TABLET | Freq: Once | ORAL | Status: AC
Start: 2023-11-24 — End: 2023-11-24
  Administered 2023-11-24: 800 mg via ORAL

## 2023-11-24 NOTE — ED Provider Notes (Signed)
 [facility]    CSN: [id number] Arrival date & time: 11/24/23  1649      History   Chief Complaint Chief Complaint  Patient presents with   Arm Injury    HPI Kelly Ward is a 58 y.o. female.   Patient presents to clinic for left lower arm pain after falling.  She lost her balance trying to get a propane gas tank out of the car and fell onto her outstretched left hand.  She is tearful in clinic and cradling her lower arm.  Denies any elbow pain.  Unable to locate the pain in her forearm, wrist or hand, it is diffuse and generalized.  Has not taken any medications or tried any interventions for the pain.  Injury occurred and patient came straight to clinic.  The history is provided by the patient and medical records.  Arm Injury   Past Medical History:  Diagnosis Date   Allergies    Anemia    Fibromyalgia    Food allergy    Gallbladder problem    Glaucoma    both eyes   Pre-diabetes    Severe obesity (BMI >= 40) (HCC) 05/29/2016   bmi 54   Vitamin B 12 deficiency    Vitamin D  deficiency     Patient Active Problem List   Diagnosis Date Noted   Pain in right knee 09/22/2022   Vitamin D  deficiency 09/12/2022   Insulin  resistance 09/12/2022   Abnormal glucose level 09/12/2022   Abnormal perimenopausal bleeding 03/14/2020   Uterine fibroid 03/12/2020   Status post total hysterectomy 03/12/2020   Severe obesity (BMI >= 40) (HCC) 05/29/2016   Skin inflammation 03/06/2016   Alopecia 08/18/2015    Past Surgical History:  Procedure Laterality Date   CHOLECYSTECTOMY N/A 05/28/2016   Procedure: LAPAROSCOPIC CHOLECYSTECTOMY ;  Surgeon: Donnice Bury, MD;  Location: [facility] OR;  Service: General;  Laterality: N/A;   COLONOSCOPY WITH PROPOFOL  N/A 12/16/2015   Procedure: COLONOSCOPY WITH PROPOFOL ;  Surgeon: Renaye Sous, MD;  Location: [facility];  Service: Endoscopy;  Laterality: N/A;   HYSTERECTOMY ABDOMINAL WITH SALPINGECTOMY N/A 03/12/2020   Procedure:  HYSTERECTOMY ABDOMINAL WITH BILATERAL SALPINGECTOMY;  Surgeon: Rutherford Gain, MD;  Location: [facility] OR;  Service: Gynecology;  Laterality: N/A;   NO PAST SURGERIES      OB History   No obstetric history on file.      Home Medications    Prior to Admission medications   Medication Sig Start Date End Date Taking? Authorizing Provider  ibuprofen  (ADVIL ) 800 MG tablet Take 1 tablet (800 mg total) by mouth 3 (three) times daily. 11/24/23  Yes Demondre Aguas  N, FNP  diphenhydrAMINE  (BENADRYL ) 25 mg capsule Take 25 mg by mouth every 6 (six) hours as needed for itching, allergies or sleep.    [provider]  latanoprost  (XALATAN ) 0.005 % ophthalmic solution Place 1 drop into both eyes at bedtime. 02/15/23     meloxicam  (MOBIC ) 15 MG tablet Take 1 tablet (15 mg total) by mouth daily. 09/21/22   McDonald, Juliene SAUNDERS, DPM  tirzepatide  (ZEPBOUND ) 5 MG/0.5ML Pen Inject 5 mg into the skin once a week. 10/29/23     Vitamin D , Ergocalciferol , (DRISDOL ) 1.25 MG (50000 UNIT) CAPS capsule Take 1 capsule (50,000 Units total) by mouth every 7 (seven) days. 05/11/21   Delores Clayborne LABOR, DO    Family History Family History  Problem Relation Age of Onset   Breast cancer Sister    Diabetes Mother  High blood pressure Mother    Thyroid  disease Mother     Social History Social History   Tobacco Use   Smoking status: Never   Smokeless tobacco: Never  Vaping Use   Vaping status: Never Used  Substance Use Topics   Alcohol use: No   Drug use: No     Allergies   Peanut-containing drug products and Shellfish allergy   Review of Systems Review of Systems  Per HPI   Physical Exam Triage Vital Signs ED Triage Vitals  Encounter Vitals Group     BP 11/24/23 1725 109/65     Systolic BP Percentile --      Diastolic BP Percentile --      Pulse Rate 11/24/23 1725 71     Resp 11/24/23 1725 16     Temp 11/24/23 1725 97.9 F (36.6 C)     Temp Source 11/24/23 1725 Oral     SpO2 11/24/23 1725  96 %     Weight --      Height 11/24/23 1724 5' 7.5 (1.715 m)     Head Circumference --      Peak Flow --      Pain Score 11/24/23 1723 10     Pain Loc --      Pain Education --      Exclude from Growth Chart --    No data found.  Updated Vital Signs BP 109/65 (BP Location: Right Arm)   Pulse 71   Temp 97.9 F (36.6 C) (Oral)   Resp 16   Ht 5' 7.5 (1.715 m)   LMP 02/09/2020 (Exact Date)   SpO2 96%   BMI 55.24 kg/m   Visual Acuity Right Eye Distance:   Left Eye Distance:   Bilateral Distance:    Right Eye Near:   Left Eye Near:    Bilateral Near:     Physical Exam Vitals and nursing note reviewed.  Constitutional:      Appearance: Normal appearance.  HENT:     Head: Normocephalic and atraumatic.     Right Ear: External ear normal.     Left Ear: External ear normal.     Nose: Nose normal.     Mouth/Throat:     Mouth: Mucous membranes are moist.  Eyes:     Conjunctiva/sclera: Conjunctivae normal.  Cardiovascular:     Rate and Rhythm: Normal rate.     Pulses: Normal pulses.  Pulmonary:     Effort: Pulmonary effort is normal. No respiratory distress.  Musculoskeletal:        General: Tenderness and signs of injury present. No swelling or deformity.  Skin:    General: Skin is warm and dry.     Capillary Refill: Capillary refill takes less than 2 seconds.  Neurological:     General: No focal deficit present.     Mental Status: She is alert and oriented to person, place, and time.  Psychiatric:        Mood and Affect: Mood normal.        Behavior: Behavior normal.      UC Treatments / Results  Labs (all labs ordered are listed, but only abnormal results are displayed) Labs Reviewed - No data to display  EKG   Radiology DG Hand Complete Left Result Date: 11/24/2023 CLINICAL DATA:  Fall with hand pain. EXAM: LEFT HAND - COMPLETE 3+ VIEW COMPARISON:  Same day wrist radiographs. FINDINGS: There is no evidence of fracture or dislocation. Mild  degenerative changes  are seen at the first carpometacarpal joint. Soft tissues are unremarkable. IMPRESSION: No acute fracture or dislocation. Electronically Signed   By: Norman Hopper M.D.   On: 11/24/2023 18:11   DG Wrist Complete Left Result Date: 11/24/2023 CLINICAL DATA:  Fall with wrist pain. EXAM: LEFT WRIST - COMPLETE 3+ VIEW COMPARISON:  Same day hand radiographs. FINDINGS: There is no evidence of fracture or dislocation. There is no evidence of arthropathy or other focal bone abnormality. Soft tissues are unremarkable. IMPRESSION: Negative. Electronically Signed   By: Norman Hopper M.D.   On: 11/24/2023 18:11   DG Forearm Left Result Date: 11/24/2023 CLINICAL DATA:  Pain, fall EXAM: LEFT FOREARM - 2 VIEW COMPARISON:  Same day wrist radiographs FINDINGS: There is no evidence of fracture or other focal bone lesions. Soft tissues are unremarkable. IMPRESSION: Negative. Electronically Signed   By: Norman Hopper M.D.   On: 11/24/2023 18:10    Procedures Procedures (including critical care time)  Medications Ordered in UC Medications  ibuprofen  (ADVIL ) tablet 800 mg (has no administration in time range)    Initial Impression / Assessment and Plan / UC Course  I have reviewed the triage vital signs and the nursing notes.  Pertinent labs & imaging results that were available during my care of the patient were reviewed by me and considered in my medical decision making (see chart for details).  Vitals and triage reviewed, patient is hemodynamically stable.  Diffuse left lower arm pain after a fall on outstretched hand earlier today.  Patient is morbidly obese.  No obvious swelling or deformity.  Range of motion of elbow intact.  Imaging does not reveal any acute fractures or dislocations.  Will provide sling for comfort.  Ibuprofen  in clinic for pain.  Ortho follow-up if symptoms persist.  Plan of care, follow-up care return precautions given, no questions at this time.     Final Clinical  Impressions(s) / UC Diagnoses   Final diagnoses:  Hand pain, left  Wrist pain, acute, left  Injury of left upper extremity, initial encounter     Discharge Instructions      Your imaging did not show any acute fractures or dislocations.  Please wear the sling throughout the day for comfort.  You can alternate between 800 mg of ibuprofen  and 500 mg of Tylenol  every 4-6 hours for any pain and inflammation.  If you are taking the meloxicam  daily, then please do not take any additional ibuprofen  as they are both NSAIDs and opt for the Tylenol .  If your pain does not improve over the next few days please consider following up with an orthopedic for further advanced evaluation.      ED Prescriptions     Medication Sig Dispense Auth. Provider   ibuprofen  (ADVIL ) 800 MG tablet Take 1 tablet (800 mg total) by mouth 3 (three) times daily. 21 tablet Dreama, Pocahontas Cohenour  N, FNP      PDMP not reviewed this encounter.   Dreama Geannie SAILOR, FNP 11/24/23 1819

## 2023-11-24 NOTE — Discharge Instructions (Addendum)
 Your imaging did not show any acute fractures or dislocations.  Please wear the sling throughout the day for comfort.  You can alternate between 800 mg of ibuprofen  and 500 mg of Tylenol  every 4-6 hours for any pain and inflammation.  If you are taking the meloxicam  daily, then please do not take any additional ibuprofen  as they are both NSAIDs and opt for the Tylenol .  If your pain does not improve over the next few days please consider following up with an orthopedic for further advanced evaluation.

## 2023-11-24 NOTE — ED Triage Notes (Signed)
 Patient here today with c/o left arm injury today from a fall. Patient states that she lost her balance when she was trying to take a propane gas tank out of the truck.

## 2023-12-18 ENCOUNTER — Other Ambulatory Visit (HOSPITAL_COMMUNITY): Payer: Self-pay

## 2023-12-18 MED ORDER — ZEPBOUND 7.5 MG/0.5ML ~~LOC~~ SOAJ
7.5000 mg | SUBCUTANEOUS | 1 refills | Status: DC
  Filled 2023-12-18: qty 2, 28d supply, fill #0
  Filled 2024-01-10: qty 2, 28d supply, fill #1

## 2024-01-10 ENCOUNTER — Other Ambulatory Visit (HOSPITAL_COMMUNITY): Payer: Self-pay

## 2024-02-11 ENCOUNTER — Other Ambulatory Visit (HOSPITAL_COMMUNITY): Payer: Self-pay

## 2024-02-11 MED ORDER — ZEPBOUND 10 MG/0.5ML ~~LOC~~ SOAJ
10.0000 mg | SUBCUTANEOUS | 3 refills | Status: DC
Start: 2024-02-11 — End: 2024-10-06
  Filled 2024-02-11: qty 2, 28d supply, fill #0
  Filled 2024-03-07: qty 2, 28d supply, fill #1
  Filled 2024-04-07 – 2024-05-05 (×2): qty 2, 28d supply, fill #2

## 2024-02-12 ENCOUNTER — Other Ambulatory Visit (HOSPITAL_COMMUNITY): Payer: Self-pay

## 2024-02-12 ENCOUNTER — Encounter (HOSPITAL_COMMUNITY): Payer: Self-pay

## 2024-03-07 ENCOUNTER — Other Ambulatory Visit (HOSPITAL_COMMUNITY): Payer: Self-pay

## 2024-04-07 ENCOUNTER — Other Ambulatory Visit (HOSPITAL_COMMUNITY): Payer: Self-pay

## 2024-05-05 ENCOUNTER — Other Ambulatory Visit (HOSPITAL_COMMUNITY): Payer: Self-pay

## 2024-05-29 ENCOUNTER — Other Ambulatory Visit (HOSPITAL_COMMUNITY): Payer: Self-pay

## 2024-05-29 MED ORDER — ZEPBOUND 7.5 MG/0.5ML ~~LOC~~ SOAJ
7.5000 mg | SUBCUTANEOUS | 1 refills | Status: DC
  Filled 2024-05-29 (×2): qty 2, 28d supply, fill #0

## 2024-09-30 ENCOUNTER — Other Ambulatory Visit: Payer: Self-pay | Admitting: Internal Medicine

## 2024-09-30 DIAGNOSIS — Z1231 Encounter for screening mammogram for malignant neoplasm of breast: Secondary | ICD-10-CM

## 2024-10-06 ENCOUNTER — Other Ambulatory Visit (HOSPITAL_COMMUNITY): Payer: Self-pay

## 2024-10-06 MED ORDER — MELOXICAM 15 MG PO TABS
15.0000 mg | ORAL_TABLET | Freq: Every day | ORAL | 3 refills | Status: AC | PRN
  Filled 2024-10-06: qty 30, 30d supply, fill #0

## 2024-11-03 ENCOUNTER — Ambulatory Visit
Admission: RE | Admit: 2024-11-03 | Discharge: 2024-11-03 | Disposition: A | Source: Ambulatory Visit | Attending: Internal Medicine | Admitting: Internal Medicine

## 2024-11-03 ENCOUNTER — Ambulatory Visit

## 2024-11-03 DIAGNOSIS — Z1231 Encounter for screening mammogram for malignant neoplasm of breast: Secondary | ICD-10-CM
